# Patient Record
Sex: Female | Born: 1985 | Race: White | Hispanic: No | Marital: Married | Smoking: Never smoker
Health system: Southern US, Community
[De-identification: ages and names within clinical notes are randomized; demographics above are authoritative.]

## PROBLEM LIST (undated history)

## (undated) DIAGNOSIS — F502 Bulimia nervosa, unspecified: Secondary | ICD-10-CM

## (undated) DIAGNOSIS — F329 Major depressive disorder, single episode, unspecified: Secondary | ICD-10-CM

## (undated) DIAGNOSIS — F32A Depression, unspecified: Secondary | ICD-10-CM

## (undated) DIAGNOSIS — E538 Deficiency of other specified B group vitamins: Secondary | ICD-10-CM

## (undated) DIAGNOSIS — F5000 Anorexia nervosa, unspecified: Secondary | ICD-10-CM

## (undated) DIAGNOSIS — F419 Anxiety disorder, unspecified: Secondary | ICD-10-CM

## (undated) DIAGNOSIS — N189 Chronic kidney disease, unspecified: Secondary | ICD-10-CM

## (undated) DIAGNOSIS — O471 False labor at or after 37 completed weeks of gestation: Secondary | ICD-10-CM

## (undated) DIAGNOSIS — N83202 Unspecified ovarian cyst, left side: Secondary | ICD-10-CM

## (undated) HISTORY — DX: Depression, unspecified: F32.A

## (undated) HISTORY — DX: Unspecified ovarian cyst, left side: N83.202

## (undated) HISTORY — DX: Anxiety disorder, unspecified: F41.9

## (undated) HISTORY — DX: Bulimia nervosa: F50.2

## (undated) HISTORY — DX: Deficiency of other specified B group vitamins: E53.8

## (undated) HISTORY — DX: Bulimia nervosa, unspecified: F50.20

## (undated) HISTORY — DX: Anorexia nervosa, unspecified: F50.00

## (undated) HISTORY — DX: Major depressive disorder, single episode, unspecified: F32.9

## (undated) HISTORY — DX: Chronic kidney disease, unspecified: N18.9

---

## 2011-04-27 DIAGNOSIS — N83202 Unspecified ovarian cyst, left side: Secondary | ICD-10-CM

## 2011-04-27 HISTORY — DX: Unspecified ovarian cyst, left side: N83.202

## 2011-09-15 ENCOUNTER — Ambulatory Visit (INDEPENDENT_AMBULATORY_CARE_PROVIDER_SITE_OTHER): Payer: BC Managed Care – PPO | Admitting: Obstetrics and Gynecology

## 2011-09-15 ENCOUNTER — Encounter: Payer: Self-pay | Admitting: Obstetrics and Gynecology

## 2011-09-15 VITALS — BP 112/64 | HR 68 | Resp 16 | Ht 59.0 in | Wt 105.0 lb

## 2011-09-15 DIAGNOSIS — Z139 Encounter for screening, unspecified: Secondary | ICD-10-CM

## 2011-09-15 DIAGNOSIS — N915 Oligomenorrhea, unspecified: Secondary | ICD-10-CM

## 2011-09-15 DIAGNOSIS — N912 Amenorrhea, unspecified: Secondary | ICD-10-CM

## 2011-09-15 DIAGNOSIS — Z8659 Personal history of other mental and behavioral disorders: Secondary | ICD-10-CM | POA: Insufficient documentation

## 2011-09-15 DIAGNOSIS — T148XXA Other injury of unspecified body region, initial encounter: Secondary | ICD-10-CM

## 2011-09-15 DIAGNOSIS — N926 Irregular menstruation, unspecified: Secondary | ICD-10-CM | POA: Insufficient documentation

## 2011-09-15 LAB — POCT URINE PREGNANCY: Preg Test, Ur: NEGATIVE

## 2011-09-15 NOTE — Progress Notes (Signed)
Addended by: Robin Searing on: 09/15/2011 04:38 PM   Modules accepted: Orders

## 2011-09-15 NOTE — Progress Notes (Addendum)
Patient reports abnormal menses since January.  A can be 2 weeks late.  She and her husband have been trying to conceive since 8 months ago. Patient reports vitamin B12 deficiency and her last shot was about a year ago.  She says that no one ever told her she had pernicious anemia and she is never known herself did be anemic.  She also reports a history of eating disorders for which she went 9 months without having a cycle.  This was when she was 55 or 26 years old.  Filed Vitals:   09/15/11 1408  BP: 112/64  Pulse: 68  Resp: 16   ROS: noncontributory  Pelvic exam:  VULVA: normal appearing vulva with no masses, tenderness or lesions,  VAGINA: normal appearing vagina with normal color and discharge, no lesions, CERVIX: normal appearing cervix without discharge or lesions,  UTERUS: uterus is normal size, shape, consistency and nontender,  ADNEXA: normal adnexa in size, nontender and no masses.  Results for orders placed in visit on 09/15/11  POCT URINE PREGNANCY      Component Value Range   Preg Test, Ur Negative       Assessment and plan Trying to conceive x Schedule ultrasound Check labs today including TSH prolactin CBC vitamin D vitamin B12 and PT PTT Return to office after ultrasound

## 2011-09-16 LAB — CBC
MCH: 31.3 pg (ref 26.0–34.0)
MCV: 90.7 fL (ref 78.0–100.0)
Platelets: 247 10*3/uL (ref 150–400)
RBC: 4.63 MIL/uL (ref 3.87–5.11)

## 2011-09-16 LAB — PROTIME-INR: INR: 1.08 (ref <1.50)

## 2011-09-16 LAB — APTT: aPTT: 31 seconds (ref 24–37)

## 2011-09-21 ENCOUNTER — Telehealth: Payer: Self-pay | Admitting: Obstetrics and Gynecology

## 2011-09-21 NOTE — Telephone Encounter (Signed)
Dr. Su Hilt, Is her Vit B-12 level being 1592 anything to worry about? It doesn't look like she's on any supplementation. She's calling for results and everything else was ok...Marland KitchenMarland KitchenMarland Kitchenbut that doesn't look right. Pls advise. Thanks, [employer]	[place]

## 2011-09-21 NOTE — Telephone Encounter (Signed)
[name]/epic °

## 2011-09-23 NOTE — Telephone Encounter (Signed)
[name]/elect. 

## 2011-09-27 NOTE — Telephone Encounter (Signed)
Are you sure she isn't taking supplements?  Refer her to her PCP with results.

## 2011-10-02 NOTE — Telephone Encounter (Signed)
Has anyone followed up on this yet?

## 2011-10-04 ENCOUNTER — Other Ambulatory Visit: Payer: Self-pay | Admitting: Obstetrics and Gynecology

## 2011-10-04 ENCOUNTER — Other Ambulatory Visit (INDEPENDENT_AMBULATORY_CARE_PROVIDER_SITE_OTHER): Payer: BC Managed Care – PPO

## 2011-10-04 ENCOUNTER — Telehealth: Payer: Self-pay

## 2011-10-04 ENCOUNTER — Ambulatory Visit (INDEPENDENT_AMBULATORY_CARE_PROVIDER_SITE_OTHER): Payer: BC Managed Care – PPO | Admitting: Obstetrics and Gynecology

## 2011-10-04 ENCOUNTER — Encounter: Payer: Self-pay | Admitting: Obstetrics and Gynecology

## 2011-10-04 VITALS — BP 100/60 | Ht 60.0 in | Wt 105.0 lb

## 2011-10-04 DIAGNOSIS — N926 Irregular menstruation, unspecified: Secondary | ICD-10-CM

## 2011-10-04 DIAGNOSIS — Z8639 Personal history of other endocrine, nutritional and metabolic disease: Secondary | ICD-10-CM

## 2011-10-04 DIAGNOSIS — Z87898 Personal history of other specified conditions: Secondary | ICD-10-CM

## 2011-10-04 DIAGNOSIS — N979 Female infertility, unspecified: Secondary | ICD-10-CM

## 2011-10-04 NOTE — Telephone Encounter (Signed)
Spoke to pt re labs. Her Vit B 12 level was really high. All other tests normal. Per AR, I Called pt to be sure she isn't taking supplements. She actually states she is Vit B deficient and she does take SL Vit B supplements tid. PCP follows this. Her PCP is Dr. Warrick Parisian. I told pt I'd check back with Dr Su Hilt and see what she says and call her back. Melody Comas A

## 2011-10-04 NOTE — Progress Notes (Signed)
Ultrasound today uterus 6.2 x 3.5 x 3.3 cm endometrial thickness 0.8 cm bilateral ovaries within normal limits except there is a 4.3 cm right hemorrhagic cyst noted with suggested followup in 6-8 weeks.  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

## 2011-10-04 NOTE — Progress Notes (Signed)
Addended by: Osborn Coho on: 10/04/2011 08:16 PM   Modules accepted: Orders

## 2011-10-13 NOTE — Telephone Encounter (Signed)
Clydie Braun, please enter normal labs into phone tree. Thanks, Rinaldo Cloud

## 2011-11-04 ENCOUNTER — Other Ambulatory Visit: Payer: BC Managed Care – PPO

## 2011-11-04 ENCOUNTER — Other Ambulatory Visit: Payer: Self-pay

## 2011-11-04 DIAGNOSIS — N978 Female infertility of other origin: Secondary | ICD-10-CM

## 2011-11-04 DIAGNOSIS — N883 Incompetence of cervix uteri: Secondary | ICD-10-CM

## 2011-11-15 ENCOUNTER — Other Ambulatory Visit: Payer: Self-pay

## 2011-11-15 DIAGNOSIS — N83209 Unspecified ovarian cyst, unspecified side: Secondary | ICD-10-CM

## 2011-11-16 ENCOUNTER — Ambulatory Visit (INDEPENDENT_AMBULATORY_CARE_PROVIDER_SITE_OTHER): Payer: BC Managed Care – PPO

## 2011-11-16 ENCOUNTER — Ambulatory Visit (INDEPENDENT_AMBULATORY_CARE_PROVIDER_SITE_OTHER): Payer: BC Managed Care – PPO | Admitting: Obstetrics and Gynecology

## 2011-11-16 ENCOUNTER — Encounter: Payer: Self-pay | Admitting: Obstetrics and Gynecology

## 2011-11-16 ENCOUNTER — Other Ambulatory Visit: Payer: Self-pay | Admitting: Obstetrics and Gynecology

## 2011-11-16 VITALS — BP 100/62 | Ht 60.0 in | Wt 103.0 lb

## 2011-11-16 DIAGNOSIS — Z3490 Encounter for supervision of normal pregnancy, unspecified, unspecified trimester: Secondary | ICD-10-CM

## 2011-11-16 DIAGNOSIS — N83209 Unspecified ovarian cyst, unspecified side: Secondary | ICD-10-CM

## 2011-11-16 DIAGNOSIS — Z139 Encounter for screening, unspecified: Secondary | ICD-10-CM

## 2011-11-16 DIAGNOSIS — Z331 Pregnant state, incidental: Secondary | ICD-10-CM

## 2011-11-16 DIAGNOSIS — N83202 Unspecified ovarian cyst, left side: Secondary | ICD-10-CM

## 2011-11-16 LAB — POCT URINE PREGNANCY: Preg Test, Ur: POSITIVE

## 2011-11-16 LAB — HCG, QUANTITATIVE, PREGNANCY: hCG, Beta Chain, Quant, S: 1189.2 m[IU]/mL

## 2011-11-16 NOTE — Addendum Note (Signed)
Addended by: Osborn Coho on: 11/16/2011 10:21 AM   Modules accepted: Orders

## 2011-11-16 NOTE — Progress Notes (Signed)
Ultrasound today uterus 7.1 x 3.7 x 3.7 cm normal right ovary with resolution of hemorrhagic cyst left ovary measures 3.3 x 5.2 x 3.8 with a 3.5 cm simple cyst, question of a small GS in uterine fundus Trying to conceive with prog 15.9 (ovulatory) UPT + 4wks and 4days by LMP of 10/15/11 Denies sxs except some cramping  Filed Vitals:   11/16/11 0949  BP: 100/62     A/P Quant today, then in 48hrs (lab visit only) If nl rise, will plan u/s in about 3wks for dating secondary to h/o abnl cycles Samples PNV Simple left ovarian cyst Resolution of rt hemorrhagic cyst

## 2011-11-18 ENCOUNTER — Other Ambulatory Visit: Payer: BC Managed Care – PPO

## 2011-11-18 DIAGNOSIS — Z3490 Encounter for supervision of normal pregnancy, unspecified, unspecified trimester: Secondary | ICD-10-CM

## 2011-11-18 LAB — HCG, QUANTITATIVE, PREGNANCY: hCG, Beta Chain, Quant, S: 2458.5 m[IU]/mL

## 2011-11-23 ENCOUNTER — Telehealth: Payer: Self-pay

## 2011-11-23 ENCOUNTER — Other Ambulatory Visit: Payer: Self-pay

## 2011-11-23 DIAGNOSIS — E538 Deficiency of other specified B group vitamins: Secondary | ICD-10-CM

## 2011-11-23 NOTE — Telephone Encounter (Signed)
Called [facility] hematology dept. To let them know I'm putting referral order in epic for Teisha to see hemaologist for vit B-12 deficiency. They will contact her. Melody Comas A

## 2011-11-23 NOTE — Telephone Encounter (Signed)
Late entry for 11/16/2011. Spoke to pt to let her know that quants are rising appropriately. She should keep all of her upcoming appts. We will also be coordinating a referral to hematologist to f/u on pts history of Vitamin B 12 deficiency. This is very important, especially now that she is newly pregnant. Jean Ellis A

## 2011-11-24 ENCOUNTER — Telehealth: Payer: Self-pay | Admitting: Hematology and Oncology

## 2011-11-24 NOTE — Telephone Encounter (Signed)
S/W PT RE APPT W/LO. PER PT SHE IS NOT SURE WHEN SHE CAN COME IN SHE WILL CALL ME BACK TOMORROW.

## 2011-11-26 ENCOUNTER — Telehealth: Payer: Self-pay | Admitting: Hematology and Oncology

## 2011-11-26 NOTE — Telephone Encounter (Signed)
S/w pt again today and she is not able to speak with me right now and doesn't know when she can come in for an appt. Per pt she is at work and will call me back re scheduling. Pt is aware that I can only hold onto her referral for 72 hrs before returning it to the referring office. 1st attempt to contact pt was 7/31.

## 2011-12-02 ENCOUNTER — Ambulatory Visit (INDEPENDENT_AMBULATORY_CARE_PROVIDER_SITE_OTHER): Payer: BC Managed Care – PPO | Admitting: Obstetrics and Gynecology

## 2011-12-02 DIAGNOSIS — Z331 Pregnant state, incidental: Secondary | ICD-10-CM

## 2011-12-02 LAB — POCT URINALYSIS DIPSTICK
Bilirubin, UA: NEGATIVE
Glucose, UA: NEGATIVE
Nitrite, UA: NEGATIVE
Spec Grav, UA: 1.02
Urobilinogen, UA: NEGATIVE

## 2011-12-03 ENCOUNTER — Other Ambulatory Visit: Payer: Self-pay | Admitting: Obstetrics and Gynecology

## 2011-12-03 LAB — PRENATAL PANEL VII
Eosinophils Absolute: 0 10*3/uL (ref 0.0–0.7)
HIV: NONREACTIVE
Hemoglobin: 12.6 g/dL (ref 12.0–15.0)
Hepatitis B Surface Ag: NEGATIVE
Lymphs Abs: 2.2 10*3/uL (ref 0.7–4.0)
MCH: 29.5 pg (ref 26.0–34.0)
Monocytes Relative: 8 % (ref 3–12)
Neutro Abs: 5.3 10*3/uL (ref 1.7–7.7)
Neutrophils Relative %: 64 % (ref 43–77)
Platelets: 234 10*3/uL (ref 150–400)
RBC: 4.27 MIL/uL (ref 3.87–5.11)
Rubella: 17.4 IU/mL
WBC: 8.2 10*3/uL (ref 4.0–10.5)

## 2011-12-03 MED ORDER — ONDANSETRON 8 MG PO TBDP: 8.0000 mg | ORAL_TABLET | Freq: Three times a day (TID) | ORAL | Status: AC | PRN

## 2011-12-03 NOTE — Telephone Encounter (Signed)
Ar pt 

## 2011-12-03 NOTE — Telephone Encounter (Signed)
Pt requesting Zofran ODT, rx e-prescribed to pt's pharmacy, pt's husband at pharmacy to pick up.  Called [facility] to make sure rx was received and it was received per pharmacy representative.

## 2011-12-04 LAB — CULTURE, OB URINE
Colony Count: NO GROWTH
Organism ID, Bacteria: NO GROWTH

## 2011-12-07 ENCOUNTER — Ambulatory Visit (INDEPENDENT_AMBULATORY_CARE_PROVIDER_SITE_OTHER): Payer: BC Managed Care – PPO

## 2011-12-07 ENCOUNTER — Other Ambulatory Visit: Payer: BC Managed Care – PPO

## 2011-12-07 ENCOUNTER — Ambulatory Visit (INDEPENDENT_AMBULATORY_CARE_PROVIDER_SITE_OTHER): Payer: BC Managed Care – PPO | Admitting: Obstetrics and Gynecology

## 2011-12-07 ENCOUNTER — Encounter: Payer: Self-pay | Admitting: Obstetrics and Gynecology

## 2011-12-07 ENCOUNTER — Other Ambulatory Visit: Payer: Self-pay | Admitting: Emergency Medicine

## 2011-12-07 VITALS — BP 90/66 | Wt 102.0 lb

## 2011-12-07 DIAGNOSIS — Z331 Pregnant state, incidental: Secondary | ICD-10-CM

## 2011-12-07 DIAGNOSIS — F411 Generalized anxiety disorder: Secondary | ICD-10-CM

## 2011-12-07 DIAGNOSIS — Z8659 Personal history of other mental and behavioral disorders: Secondary | ICD-10-CM

## 2011-12-07 DIAGNOSIS — F3289 Other specified depressive episodes: Secondary | ICD-10-CM

## 2011-12-07 DIAGNOSIS — F419 Anxiety disorder, unspecified: Secondary | ICD-10-CM

## 2011-12-07 DIAGNOSIS — Z36 Encounter for antenatal screening of mother: Secondary | ICD-10-CM

## 2011-12-07 DIAGNOSIS — F32A Depression, unspecified: Secondary | ICD-10-CM

## 2011-12-07 DIAGNOSIS — Z124 Encounter for screening for malignant neoplasm of cervix: Secondary | ICD-10-CM

## 2011-12-07 DIAGNOSIS — F329 Major depressive disorder, single episode, unspecified: Secondary | ICD-10-CM

## 2011-12-07 LAB — US OB COMP LESS 14 WKS

## 2011-12-07 NOTE — Progress Notes (Signed)
Pt declines genetic screening  WNL dating u/s today EDD 07/21/12

## 2011-12-07 NOTE — Progress Notes (Signed)
Patient ID: Jean Ellis, female   DOB: [birth year], 26 y.o.   MRN: [id number]  Jean Ellis is here for a new obstetrical visit.  Taking PNV, certain of LMP not on birth control was planned pg. States past hx age 40-18 of anorexia, bulemia, anxiety, and depression had counseling. [redacted]w[redacted]d IUP ovaries WNL [place] today agrees with EDC. @IPILAPH @ OB History    Grav Para Term Preterm Abortions TAB SAB Ect Mult Living   1              Past Medical History  Diagnosis Date  . Vitamin B12 deficiency   . Ovarian cyst, left 2013    seen on recent US  . Chronic kidney disease     Frequent kidney infections @ 4-5 yoa  . Anxiety     Prozac in the past  . Depression     Prozac in the past   History reviewed. No pertinent past surgical history. Family History: family history includes Alcohol abuse in her father; Anemia in her mother; Anxiety disorder in her father; COPD in her father; Depression in her father; Diabetes in her paternal grandfather and paternal grandmother; Drug abuse in her father; Liver disease in her father; and Thyroid disease in her maternal aunt. Social History:  reports that she has never smoked. She has never used smokeless tobacco. She reports that she does not drink alcohol or use illicit drugs.    Blood pressure 90/66, weight 102 lb (46.267 kg), last menstrual period 10/15/2011.  Physical exam: Calm, no distress Alert, appropriate appearance for age. No acute distress HEENT Grossly normal Thyroid no masses, nodes or enlargement  lungs clear bilaterally, AP RRR, breasts bilaterally no masses, dimpling, drainage, abd nt, bowel sounds active No edema to lower extremities EGBUS and perineum wnl, normal hair distribution Vagina pink moist Cervix LTC no cerical motion tenderness appears adequate 158 FHTS  Prenatal labs: ABO, Rh: O/POS/-- (08/08 1525) Antibody: NEG (08/08 1525) Rubella:  Immune RPR: NON REAC (08/08 1525)  HBsAg: NEGATIVE (08/08 1525)  HIV: NON  REACTIVE (08/08 1525)  GBS:    Prenatal labs wnl Assessment/Plan: 7 4/7 week IUP Gc/chl sent Wet prep: neg PH not indicated Pap: sent WU:[id number] Genetic screen:declines f/o 5 weeks.  Collaboration with Dr. Su Hilt. [name] 12/07/2011, 11:58 AM Lavera Guise, CNM

## 2011-12-09 ENCOUNTER — Telehealth: Payer: Self-pay | Admitting: Hematology and Oncology

## 2011-12-09 LAB — PAP IG, CT-NG, RFX HPV ASCU: Chlamydia Probe Amp: NEGATIVE

## 2011-12-09 NOTE — Telephone Encounter (Signed)
Pt has not returned calls to our office re scheduling an appt w/LO. I spoke with pt on two occasions and pt states she couldn't talk and would call me back. Pt has since not responded. Called pt again today and lmonvm letting her know referral has been sent back to referring and she could let [facility] know when she is ready to schedule this appt. Chart back to HIM.

## 2012-01-11 ENCOUNTER — Encounter: Payer: Self-pay | Admitting: Obstetrics and Gynecology

## 2012-01-11 ENCOUNTER — Ambulatory Visit (INDEPENDENT_AMBULATORY_CARE_PROVIDER_SITE_OTHER): Payer: BC Managed Care – PPO | Admitting: Obstetrics and Gynecology

## 2012-01-11 VITALS — BP 88/64 | Wt 102.0 lb

## 2012-01-11 DIAGNOSIS — Z331 Pregnant state, incidental: Secondary | ICD-10-CM

## 2012-01-11 DIAGNOSIS — Z3490 Encounter for supervision of normal pregnancy, unspecified, unspecified trimester: Secondary | ICD-10-CM

## 2012-01-11 NOTE — Progress Notes (Signed)
No complaints N/V improved Plans to go to [facility] here in town to find out sex of baby at about 15wks U/S for anatomy due 18-20wks Declines genetic testing Reviewed labs RTO 4wks

## 2012-02-10 ENCOUNTER — Ambulatory Visit (INDEPENDENT_AMBULATORY_CARE_PROVIDER_SITE_OTHER): Payer: BC Managed Care – PPO | Admitting: Obstetrics and Gynecology

## 2012-02-10 VITALS — BP 90/60 | Wt 105.0 lb

## 2012-02-10 DIAGNOSIS — Z331 Pregnant state, incidental: Secondary | ICD-10-CM

## 2012-02-10 DIAGNOSIS — Z3490 Encounter for supervision of normal pregnancy, unspecified, unspecified trimester: Secondary | ICD-10-CM

## 2012-02-10 NOTE — Progress Notes (Signed)
[redacted]w[redacted]d Doing well Cont to decline genetic testing Anatomy scan at [facility] in 2wks

## 2012-02-23 ENCOUNTER — Other Ambulatory Visit: Payer: Self-pay

## 2012-02-23 DIAGNOSIS — Z3689 Encounter for other specified antenatal screening: Secondary | ICD-10-CM

## 2012-02-24 ENCOUNTER — Ambulatory Visit (INDEPENDENT_AMBULATORY_CARE_PROVIDER_SITE_OTHER): Payer: BC Managed Care – PPO

## 2012-02-24 ENCOUNTER — Ambulatory Visit (INDEPENDENT_AMBULATORY_CARE_PROVIDER_SITE_OTHER): Payer: BC Managed Care – PPO | Admitting: Obstetrics and Gynecology

## 2012-02-24 ENCOUNTER — Encounter: Payer: Self-pay | Admitting: Obstetrics and Gynecology

## 2012-02-24 VITALS — BP 90/60 | Wt 105.0 lb

## 2012-02-24 DIAGNOSIS — Z8639 Personal history of other endocrine, nutritional and metabolic disease: Secondary | ICD-10-CM

## 2012-02-24 DIAGNOSIS — Z3689 Encounter for other specified antenatal screening: Secondary | ICD-10-CM

## 2012-02-24 DIAGNOSIS — Z3490 Encounter for supervision of normal pregnancy, unspecified, unspecified trimester: Secondary | ICD-10-CM | POA: Insufficient documentation

## 2012-02-24 DIAGNOSIS — Z331 Pregnant state, incidental: Secondary | ICD-10-CM

## 2012-02-24 LAB — CBC
Hemoglobin: 11.9 g/dL — ABNORMAL LOW (ref 12.0–15.0)
MCHC: 34.5 g/dL (ref 30.0–36.0)
RDW: 13.6 % (ref 11.5–15.5)
WBC: 11 10*3/uL — ABNORMAL HIGH (ref 4.0–10.5)

## 2012-02-24 NOTE — Addendum Note (Signed)
Addended by: Marla Roe A on: 02/24/2012 02:48 PM   Modules accepted: Orders

## 2012-02-24 NOTE — Progress Notes (Signed)
[redacted]w[redacted]d U/S today - C/w dates, cvx closed 3.3cm, ant placenta, no anomalies, female TSH, cbc, vit B12 H/o vit B12 def and pt said she has gotten dizzy when it was low in the past Also c/o sinus issues and re trying antihistamines but if persists consider referral to ENT

## 2012-02-24 NOTE — Addendum Note (Signed)
Addended by: Marla Roe A on: 02/24/2012 03:37 PM   Modules accepted: Orders

## 2012-02-25 LAB — VITAMIN B12: Vitamin B-12: 411 pg/mL (ref 211–911)

## 2012-02-25 LAB — US OB COMP + 14 WK

## 2012-02-25 LAB — TSH: TSH: 1.443 u[IU]/mL (ref 0.350–4.500)

## 2012-03-21 ENCOUNTER — Ambulatory Visit (INDEPENDENT_AMBULATORY_CARE_PROVIDER_SITE_OTHER): Payer: BC Managed Care – PPO | Admitting: Obstetrics and Gynecology

## 2012-03-21 VITALS — BP 92/60 | Wt 111.0 lb

## 2012-03-21 DIAGNOSIS — Z3490 Encounter for supervision of normal pregnancy, unspecified, unspecified trimester: Secondary | ICD-10-CM

## 2012-03-21 DIAGNOSIS — Z331 Pregnant state, incidental: Secondary | ICD-10-CM

## 2012-03-21 NOTE — Progress Notes (Signed)
[redacted]w[redacted]d C/o rare pressure - declined exam S<D but pt is small and husband weighed 5lbs at birth - u/s for efw on dec 18th No complaints Labs were wnl

## 2012-03-21 NOTE — Addendum Note (Signed)
Addended by: Marla Roe A on: 03/21/2012 06:04 PM   Modules accepted: Orders

## 2012-03-22 ENCOUNTER — Telehealth: Payer: Self-pay

## 2012-03-22 ENCOUNTER — Ambulatory Visit (INDEPENDENT_AMBULATORY_CARE_PROVIDER_SITE_OTHER): Payer: BC Managed Care – PPO

## 2012-03-22 ENCOUNTER — Telehealth: Payer: Self-pay | Admitting: Obstetrics and Gynecology

## 2012-03-22 ENCOUNTER — Ambulatory Visit (INDEPENDENT_AMBULATORY_CARE_PROVIDER_SITE_OTHER): Payer: BC Managed Care – PPO | Admitting: Obstetrics and Gynecology

## 2012-03-22 ENCOUNTER — Encounter: Payer: Self-pay | Admitting: Obstetrics and Gynecology

## 2012-03-22 ENCOUNTER — Other Ambulatory Visit: Payer: Self-pay | Admitting: Obstetrics and Gynecology

## 2012-03-22 VITALS — BP 96/60 | Temp 98.6°F | Wt 111.0 lb

## 2012-03-22 DIAGNOSIS — O26879 Cervical shortening, unspecified trimester: Secondary | ICD-10-CM

## 2012-03-22 DIAGNOSIS — O26899 Other specified pregnancy related conditions, unspecified trimester: Secondary | ICD-10-CM

## 2012-03-22 DIAGNOSIS — N898 Other specified noninflammatory disorders of vagina: Secondary | ICD-10-CM

## 2012-03-22 LAB — POCT URINALYSIS DIPSTICK
Blood, UA: NEGATIVE
Glucose, UA: NEGATIVE
Nitrite, UA: NEGATIVE
Urobilinogen, UA: NEGATIVE
pH, UA: >=9

## 2012-03-22 LAB — POCT WET PREP (WET MOUNT)
Clue Cells Wet Prep Whiff POC: NEGATIVE
Trichomonas Wet Prep HPF POC: NEGATIVE
pH: 4.5

## 2012-03-22 NOTE — Telephone Encounter (Signed)
TC from pt. States is having watery vaginal D/C today, enough to wet underwear. Lower abd discomfort. No recent IC. Scheduled for eval with DR SR today.

## 2012-03-22 NOTE — Progress Notes (Signed)
[redacted]w[redacted]d   Pt complains of watery vaginal discharge noticed today. Pt without complaints of burning or itching

## 2012-03-22 NOTE — Telephone Encounter (Signed)
LVM to advise pt that FFN was negative.  Jean Ellis

## 2012-03-22 NOTE — Progress Notes (Signed)
[redacted]w[redacted]d GFM  Short Cervix  Seen yesterday and was told S<D: ultrasound today: AGA with EFW 1 lbs 2 oz 33 % and normal AFI. Breech. Cervix= 4.26 cm. Left cerebral ventricle upper normal at 0.85 cm to be rechecked at [facility] This am noticed clear mucousy D/C ( felt like peeing) with cramping that worsens with change in position. No sex Wet prep is normal. Nitrazine is negative. Crist Fat -

## 2012-03-27 LAB — US OB TRANSVAGINAL

## 2012-03-27 LAB — US OB FOLLOW UP

## 2012-04-12 ENCOUNTER — Other Ambulatory Visit: Payer: BC Managed Care – PPO

## 2012-04-12 ENCOUNTER — Encounter: Payer: BC Managed Care – PPO | Admitting: Obstetrics and Gynecology

## 2012-04-18 ENCOUNTER — Encounter: Payer: Self-pay | Admitting: Obstetrics and Gynecology

## 2012-04-18 ENCOUNTER — Ambulatory Visit (INDEPENDENT_AMBULATORY_CARE_PROVIDER_SITE_OTHER): Payer: BC Managed Care – PPO

## 2012-04-18 ENCOUNTER — Ambulatory Visit (INDEPENDENT_AMBULATORY_CARE_PROVIDER_SITE_OTHER): Payer: BC Managed Care – PPO | Admitting: Obstetrics and Gynecology

## 2012-04-18 VITALS — BP 98/62 | Wt 113.0 lb

## 2012-04-18 DIAGNOSIS — IMO0002 Reserved for concepts with insufficient information to code with codable children: Secondary | ICD-10-CM | POA: Insufficient documentation

## 2012-04-18 DIAGNOSIS — Z331 Pregnant state, incidental: Secondary | ICD-10-CM

## 2012-04-18 DIAGNOSIS — Z3490 Encounter for supervision of normal pregnancy, unspecified, unspecified trimester: Secondary | ICD-10-CM

## 2012-04-18 DIAGNOSIS — Q897 Multiple congenital malformations, not elsewhere classified: Secondary | ICD-10-CM

## 2012-04-18 NOTE — Progress Notes (Signed)
Ultrasound shows:  SIUP  S=D     [place] EDD: 07/29/12           EFW: 1 lb 13 oz 31th%           AFI: n/a           Cervical length: 3.34 cm           Placenta localization: anterior           Fetal presentation: vertex Normal fluid. AP pocket = 5.0 cm.  Lateral ventricles are upper normal bilaterally. All other cranial anatomy is WNLs. No dilation of 3rd ventricle is seen. RT ventricle = 9.8 mm LT ventricle = 8.3 mm Previous measurements = RT: 5.2 mm LT: 8.5 mm cx closed. Normal adnexa

## 2012-04-18 NOTE — Progress Notes (Signed)
[redacted]w[redacted]d  GFM Ultrasound today to recheck cerebral ventricle upper limit: today still upper limit without dilatation and with normal intracranial anatomy Will repeat ultrasound at 34 weeks Glucola at [facility]

## 2012-04-26 NOTE — L&D Delivery Note (Signed)
Delivery Note At 2:49 PM a viable female, "Jean Ellis", was delivered via Vaginal, Spontaneous Delivery (Presentation: Left Occiput Anterior) with compound presentation of right arm.  APGAR: 8, 9; weight .   Placenta status: Intact, Spontaneous.  Cord: 3 vessels with the following complications: None.  Cord pH: NA  Anesthesia:  Epidural Episiotomy: None Lacerations: 2nd degree vaginal Suture Repair: 3.0 monocryl Est. Blood Loss (mL): 400  Mom to postpartum.  Baby to skin to skin.Marland Kitchen  [name] 07/15/2012, 3:24 PM

## 2012-05-12 ENCOUNTER — Telehealth: Payer: Self-pay | Admitting: Obstetrics and Gynecology

## 2012-05-12 ENCOUNTER — Encounter: Payer: Self-pay | Admitting: Obstetrics and Gynecology

## 2012-05-12 ENCOUNTER — Other Ambulatory Visit: Payer: BC Managed Care – PPO

## 2012-05-12 ENCOUNTER — Ambulatory Visit: Payer: BC Managed Care – PPO | Admitting: Obstetrics and Gynecology

## 2012-05-12 VITALS — BP 96/56 | Wt 116.0 lb

## 2012-05-12 DIAGNOSIS — O26849 Uterine size-date discrepancy, unspecified trimester: Secondary | ICD-10-CM

## 2012-05-12 DIAGNOSIS — Z331 Pregnant state, incidental: Secondary | ICD-10-CM

## 2012-05-12 LAB — FETAL FIBRONECTIN: Fetal Fibronectin: NEGATIVE

## 2012-05-12 NOTE — Telephone Encounter (Signed)
Called pt informed her that her FFN was negative

## 2012-05-12 NOTE — Progress Notes (Signed)
[redacted]w[redacted]d Glucola given today Pt has concerns of pressure feels like has a tampon in

## 2012-05-12 NOTE — Progress Notes (Signed)
[redacted]w[redacted]d GFM  C/O pressure: FFN done PTL precautions reviewed Glucola today FFN done [facility] Pelvic pain caused by baby lying on nerve endings Braxton hicks contractions discussed S<D: sono at [facility]

## 2012-05-13 LAB — GLUCOSE TOLERANCE, 1 HOUR (50G) W/O FASTING: Glucose, 1 Hour GTT: 141 mg/dL — ABNORMAL HIGH (ref 70–140)

## 2012-05-13 LAB — RPR

## 2012-05-15 ENCOUNTER — Telehealth: Payer: Self-pay | Admitting: Obstetrics and Gynecology

## 2012-05-15 ENCOUNTER — Other Ambulatory Visit: Payer: Self-pay | Admitting: Obstetrics and Gynecology

## 2012-05-15 DIAGNOSIS — O99810 Abnormal glucose complicating pregnancy: Secondary | ICD-10-CM

## 2012-05-15 NOTE — Telephone Encounter (Signed)
Notified pt of elevated glucola.  Sch pt for 3 hr GTT on 05-27-2012, pt could only go on a Saturday morning.  Mailed pt diet and appt info.

## 2012-05-15 NOTE — Progress Notes (Signed)
Quick Note:  Abnormal 1 hour Gucola. Needs 3 hour GTT scheduled. ______

## 2012-05-17 ENCOUNTER — Telehealth: Payer: Self-pay | Admitting: Obstetrics and Gynecology

## 2012-05-17 NOTE — Telephone Encounter (Signed)
Tc to pt c/o hemorrhoids and would like to know what it safe to use. Pt states it is not causing any pain or bleeding. Advised pt to try to sit in a tub of warm water and to avoid becoming constipated. Advised pt that if she becomes constipated she can use a stool softener for relief. Also advised pt to call the office if she experiences any severe bleeding or pain due to hemorrhoid. Pt voiced understanding.

## 2012-05-24 ENCOUNTER — Ambulatory Visit: Payer: BC Managed Care – PPO | Admitting: Obstetrics and Gynecology

## 2012-05-24 ENCOUNTER — Ambulatory Visit: Payer: BC Managed Care – PPO

## 2012-05-24 ENCOUNTER — Encounter: Payer: Self-pay | Admitting: Obstetrics and Gynecology

## 2012-05-24 VITALS — BP 94/54 | Wt 120.0 lb

## 2012-05-24 DIAGNOSIS — Z331 Pregnant state, incidental: Secondary | ICD-10-CM

## 2012-05-24 DIAGNOSIS — O26849 Uterine size-date discrepancy, unspecified trimester: Secondary | ICD-10-CM

## 2012-05-24 DIAGNOSIS — IMO0002 Reserved for concepts with insufficient information to code with codable children: Secondary | ICD-10-CM

## 2012-05-24 NOTE — Progress Notes (Signed)
[redacted]w[redacted]d  GFM Back pain exercise given 1 hr GTT= 141   3 hr GTT 05/27/12

## 2012-05-24 NOTE — Progress Notes (Signed)
[redacted]w[redacted]d Pt has a lot of mid back pain x 1 week

## 2012-05-25 LAB — US OB FOLLOW UP

## 2012-05-28 LAB — GLUCOSE TOLERANCE, 3 HOURS
Glucose Tolerance, 2 hour: 96 mg/dL (ref 70–164)
Glucose, GTT - 3 Hour: 89 mg/dL (ref 70–144)

## 2012-06-02 ENCOUNTER — Telehealth: Payer: Self-pay | Admitting: Obstetrics and Gynecology

## 2012-06-02 DIAGNOSIS — R7309 Other abnormal glucose: Secondary | ICD-10-CM

## 2012-06-05 NOTE — Telephone Encounter (Addendum)
Pt had 3 hr GTT on 05/15/12. Advised pt that labs were normal.   Darien Ramus, CMA

## 2012-06-14 ENCOUNTER — Other Ambulatory Visit: Payer: Self-pay

## 2012-06-14 ENCOUNTER — Ambulatory Visit: Payer: BC Managed Care – PPO | Admitting: Obstetrics and Gynecology

## 2012-06-14 ENCOUNTER — Ambulatory Visit: Payer: BC Managed Care – PPO

## 2012-06-14 ENCOUNTER — Encounter: Payer: Self-pay | Admitting: Obstetrics and Gynecology

## 2012-06-14 VITALS — BP 92/56 | Wt 123.0 lb

## 2012-06-14 DIAGNOSIS — IMO0002 Reserved for concepts with insufficient information to code with codable children: Secondary | ICD-10-CM

## 2012-06-14 DIAGNOSIS — O26849 Uterine size-date discrepancy, unspecified trimester: Secondary | ICD-10-CM

## 2012-06-14 DIAGNOSIS — Z331 Pregnant state, incidental: Secondary | ICD-10-CM

## 2012-06-14 NOTE — Progress Notes (Signed)
[redacted]w[redacted]d  GFM Ultrasound: AGA, EFW: 5 lbs 4 oz  32%, AFI normal

## 2012-06-14 NOTE — Progress Notes (Signed)
[redacted]w[redacted]d U/S today Pt has no complaints

## 2012-06-15 LAB — US OB FOLLOW UP

## 2012-06-20 ENCOUNTER — Ambulatory Visit: Payer: BC Managed Care – PPO | Admitting: Obstetrics and Gynecology

## 2012-06-20 ENCOUNTER — Encounter: Payer: Self-pay | Admitting: Obstetrics and Gynecology

## 2012-06-20 VITALS — BP 92/56 | Wt 123.0 lb

## 2012-06-20 DIAGNOSIS — IMO0002 Reserved for concepts with insufficient information to code with codable children: Secondary | ICD-10-CM

## 2012-06-20 DIAGNOSIS — Z331 Pregnant state, incidental: Secondary | ICD-10-CM

## 2012-06-20 NOTE — Progress Notes (Signed)
[redacted]w[redacted]d Pt has shooting pains underneath belly Pt wants cervix checked

## 2012-06-20 NOTE — Progress Notes (Signed)
[redacted]w[redacted]d Still very uncomfortable but coping No contractions GBS at next visit

## 2012-06-22 ENCOUNTER — Telehealth: Payer: Self-pay

## 2012-06-22 NOTE — Telephone Encounter (Signed)
Advised pt of apts and gap between. Pt ok with this. apts scheduled 06/26/2012 U/S @ 12:00 SR @ 1:30  Darien Ramus, CMA

## 2012-06-22 NOTE — Telephone Encounter (Signed)
VM from pt states that she is to have an u/s and visit next week. Was advised that there were not available apts wanting to see SR.  LVM to advise pt that we could schedule u/s on 06/26/2012 @ 12:00 and she could return to office for visit with SR @ 1:30. Pt is to return call.   Darien Ramus, CMA

## 2012-06-26 ENCOUNTER — Encounter: Payer: Self-pay | Admitting: Obstetrics and Gynecology

## 2012-06-26 ENCOUNTER — Ambulatory Visit: Payer: BC Managed Care – PPO | Admitting: Obstetrics and Gynecology

## 2012-06-26 ENCOUNTER — Ambulatory Visit: Payer: BC Managed Care – PPO

## 2012-06-26 VITALS — BP 96/56 | Wt 126.0 lb

## 2012-06-26 DIAGNOSIS — IMO0002 Reserved for concepts with insufficient information to code with codable children: Secondary | ICD-10-CM

## 2012-06-26 DIAGNOSIS — Z331 Pregnant state, incidental: Secondary | ICD-10-CM

## 2012-06-26 NOTE — Progress Notes (Signed)
[redacted]w[redacted]d GBS today Pt has no complaints  

## 2012-06-26 NOTE — Progress Notes (Signed)
[redacted]w[redacted]d GFM GBS & GC/CT today Ultrasound with AGA and normal AFI. Cerebral ventricles are measuring normal. Labor Signs Reviewed

## 2012-06-26 NOTE — Progress Notes (Signed)
[redacted]w[redacted]d GBS today Ultrasound shows:  SIUP  S=D     [place] EDD: 07/21/2012           EFW: 5 lb 15 oz 29%           AFI: 12.75 cm 40th %            Cervical length: not measured           Placenta localization: anterior           Fetal presentation: vertex Comments: Bilateral Cerebral Ventricles are within normal limits. Normal adnexa

## 2012-06-27 LAB — GC/CHLAMYDIA PROBE AMP: CT Probe RNA: NEGATIVE

## 2012-06-27 LAB — US OB FOLLOW UP

## 2012-06-28 ENCOUNTER — Telehealth: Payer: Self-pay

## 2012-06-28 LAB — STREP B DNA PROBE: GBSP: NEGATIVE

## 2012-06-28 NOTE — Telephone Encounter (Signed)
VM from pt wanting to know next apt date.  LVM to advise pt that visit is 07/04/2012 @ 1:30 with SR.   Darien Ramus, CMA

## 2012-07-03 ENCOUNTER — Telehealth: Payer: Self-pay

## 2012-07-03 NOTE — Telephone Encounter (Signed)
TC from pt states that she ate and drunk some more water. States that after eating the baby moved 5-8 times. States that she is still noticing slight d/c. Advised pt that we could schedule apt to come in to access d/c pt declines. Has apt on Thursday. Advised pt to continue to monitor movement. Advised that if no movement after next meal to call office. Continue to push fluid. If notice any bright red bleeding contact office. Labor signs reviewed.  Darien Ramus, CMA

## 2012-07-03 NOTE — Telephone Encounter (Signed)
Phone call from pt states that this morning she noticed some brownish d/c when she used the restroom and wiped. States that she is having some vaginal pressure but is normal for her. No contractions. States that she is feeling baby move. States that she feels it has been decreased, but pt has been busy this weekend and has not really paid much attention to movement. Pt states that she ate @ 7:15 a.m. Has had approximately 8 oz of water. Has only noticed d/c one time. Advised pt to increase water and eat high protein snack, count baby movements. Watch d/c. Call office back in a couple hours with report.  Pt agrees with plan and voiced understanding   Khalis, Hittle, CMA

## 2012-07-04 ENCOUNTER — Encounter: Payer: BC Managed Care – PPO | Admitting: Obstetrics and Gynecology

## 2012-07-06 ENCOUNTER — Encounter: Payer: Self-pay | Admitting: Obstetrics and Gynecology

## 2012-07-06 ENCOUNTER — Ambulatory Visit: Payer: BC Managed Care – PPO | Admitting: Obstetrics and Gynecology

## 2012-07-06 VITALS — BP 94/62 | Wt 126.0 lb

## 2012-07-06 DIAGNOSIS — Z331 Pregnant state, incidental: Secondary | ICD-10-CM

## 2012-07-06 NOTE — Progress Notes (Signed)
[redacted]w[redacted]d Contractions every 10 minutes last night, slept fine through the night and eased off today.  Membranes swept. Raspberry Tea Recommended.

## 2012-07-06 NOTE — Progress Notes (Signed)
[redacted]w[redacted]d Pt has no complaints  Pt wants cervix check

## 2012-07-10 ENCOUNTER — Observation Stay (HOSPITAL_COMMUNITY)
Admission: AD | Admit: 2012-07-10 | Discharge: 2012-07-10 | Disposition: A | Discharge status: Home / Self Care | Payer: BC Managed Care – PPO | Source: Ambulatory Visit | Attending: Obstetrics and Gynecology | Admitting: Obstetrics and Gynecology

## 2012-07-10 ENCOUNTER — Telehealth: Payer: Self-pay

## 2012-07-10 ENCOUNTER — Encounter (HOSPITAL_COMMUNITY): Payer: Self-pay | Admitting: *Deleted

## 2012-07-10 ENCOUNTER — Observation Stay (HOSPITAL_COMMUNITY): Payer: BC Managed Care – PPO

## 2012-07-10 ENCOUNTER — Ambulatory Visit: Payer: BC Managed Care – PPO | Admitting: Family Medicine

## 2012-07-10 VITALS — BP 100/70 | Wt 125.0 lb

## 2012-07-10 DIAGNOSIS — O479 False labor, unspecified: Principal | ICD-10-CM | POA: Insufficient documentation

## 2012-07-10 DIAGNOSIS — R109 Unspecified abdominal pain: Secondary | ICD-10-CM | POA: Insufficient documentation

## 2012-07-10 DIAGNOSIS — IMO0002 Reserved for concepts with insufficient information to code with codable children: Secondary | ICD-10-CM

## 2012-07-10 DIAGNOSIS — Z3490 Encounter for supervision of normal pregnancy, unspecified, unspecified trimester: Secondary | ICD-10-CM

## 2012-07-10 DIAGNOSIS — Z331 Pregnant state, incidental: Secondary | ICD-10-CM

## 2012-07-10 DIAGNOSIS — O26859 Spotting complicating pregnancy, unspecified trimester: Secondary | ICD-10-CM | POA: Insufficient documentation

## 2012-07-10 DIAGNOSIS — O26893 Other specified pregnancy related conditions, third trimester: Secondary | ICD-10-CM

## 2012-07-10 DIAGNOSIS — O471 False labor at or after 37 completed weeks of gestation: Secondary | ICD-10-CM | POA: Diagnosis not present

## 2012-07-10 HISTORY — DX: False labor at or after 37 completed weeks of gestation: O47.1

## 2012-07-10 LAB — CBC
MCH: 27.6 pg (ref 26.0–34.0)
Platelets: 248 10*3/uL (ref 150–400)
RBC: 3.44 MIL/uL — ABNORMAL LOW (ref 3.87–5.11)
WBC: 10.3 10*3/uL (ref 4.0–10.5)

## 2012-07-10 MED ORDER — ZOLPIDEM TARTRATE 5 MG PO TABS: 5.0000 mg | ORAL_TABLET | Freq: Every evening | ORAL | Status: DC | PRN

## 2012-07-10 MED ORDER — LACTATED RINGERS IV BOLUS (SEPSIS)
500.0000 mL | Freq: Once | INTRAVENOUS | Status: AC
  Administered 2012-07-10: 1000 mL via INTRAVENOUS

## 2012-07-10 MED ORDER — LACTATED RINGERS IV SOLN
INTRAVENOUS | Status: DC
  Administered 2012-07-10: 15:00:00 via INTRAVENOUS

## 2012-07-10 NOTE — Progress Notes (Signed)
Patient ID: Jean Ellis, female   DOB: 1985-10-29, 27 y.o.   MRN: [id number] History  27yo G1P0 at [redacted]w[redacted]d presented from the office with a non-reactive but reassuring NST.  Pt reports UCs since Friday with regularity at times and spacing out at times.  Pt noted small vaginal clots today.  +FM but the movements "have not been as much lately because the baby doesn't have as much room"  Patient Active Problem List  Diagnosis  . H/O bulimia nervosa  . H/O anorexia nervosa  . Irregular menstrual cycle  . H/O non anemic vitamin B12 deficiency - nl f/u  . Anxiety  . Depression  . Normal pregnancy  . Fetal anomaly     No chief complaint on file.    OB History   Grav Para Term Preterm Abortions TAB SAB Ect Mult Living   1               Past Medical History  Diagnosis Date  . Vitamin B12 deficiency   . Ovarian cyst, left 2013    seen on recent US  . Chronic kidney disease     Frequent kidney infections @ 4-5 yoa  . Anxiety     Prozac in the past  . Depression     Prozac in the past    History reviewed. No pertinent past surgical history.  Family History  Problem Relation Age of Onset  . Diabetes Paternal Grandmother   . Diabetes Paternal Grandfather   . Thyroid disease Maternal Aunt   . Anemia Mother   . COPD Father   . Liver disease Father   . Depression Father   . Anxiety disorder Father   . Drug abuse Father   . Alcohol abuse Father     History  Substance Use Topics  . Smoking status: Never Smoker   . Smokeless tobacco: Never Used  . Alcohol Use: No     Comment: 1 glass of wine bi-weekly    Allergies: No Known Allergies  Prescriptions prior to admission  Medication Sig Dispense Refill  . amoxicillin (AMOXIL) 250 MG capsule Take 250 mg by mouth 3 (three) times daily.      . Prenatal Vit-Fe Sulfate-FA (PRENATAL VITAMIN PO) Take 1 tablet by mouth daily.         Physical Exam   Blood pressure 125/58, pulse 113, temperature 98.2 F (36.8 C), resp.  rate 20, last menstrual period 10/15/2011.  Chest: Clear Heart:  RRR without murmur Abdomen: Gravid, NT Extremities: WNL Cx: 4cm / 60 / -2 / anterior   FHT: Cat 2; decreased variability; ? Late decels with 2 UCs UC: Q5-7 min   ED Course  IUP at [redacted]w[redacted]d Early vs Prodromal labor Equivocal FHT GBS neg  IV hydration Continue to monitor FHTs Re-evaluate cx in 1-2 hours   Jean Ellis CNM, [place] 07/10/2012 2:35 PM   Addendum  Cx unchanged FHT: Reactive; during sleep phase  Questionable - 2 episodes of late variables noted with UCs C/w Dr. Stefano Gaul BPP and AFI  Discussed plan of BPP and AFI and pt is in agreement; Pt is expressing the desire to "get this over with" d/t mainly the back pain she is experiencing.  Tearful with the idea of going home.   Jean Ellis CNM MSN 07/10/12   4:30 PM   Addendum Cx unchanged BPP 8/8 D/C home by myself and Hillary Reviewed the importance rest and sleep at this early stage; rv'd s/s of labor; FKC; rv'd importance of  good hydration

## 2012-07-10 NOTE — Telephone Encounter (Signed)
TC from pt states that she passed her mucous plug on Saturday. States that she has been having contractions but are not regular. Spoke to midwife on call Saturday but was never seen in the hosp. States that she has been having sharp stabbing pain in pelvic area since Saturday and has noticed some bright red blood when wiping. GFM. Advised pt she needed to be seen in the office today. Apt scheduled with LC, NP @ 10:30  Darien Ramus, CMA

## 2012-07-10 NOTE — Progress Notes (Signed)
[redacted]w[redacted]d Complains of contractions and spotting/clots this am.

## 2012-07-10 NOTE — Progress Notes (Signed)
[redacted]w[redacted]d Presents with irregular contractions x 24 hours and spotting on tissue.  Pink to bright red about dime size.  Pain 5/10.  Pain starts in lower abdomen and radiates to top.  Noticed they increase in frequency while walking.  Otherwise doing well with good fetal movement. A/P: NST: FHR 145 moderate variability and 10 x 10 accels.  Toco: irregular contractions q 2-10 minutes.  Mild to moderate. Pt sitting on exam bed/chair, NAD.  Discussed prodromal labor with patient, offered to go home and monitor contractions for regularity, she declined.   Sent over to MAU for monitoring and understands this will be done by midwives, Dr. Estanislado Pandy is unavailable. Discussed with J.Oxley/Stringer. ROB in 1 week if undelivered. Jean Grout, FNP-BC

## 2012-07-11 LAB — RPR: RPR Ser Ql: NONREACTIVE

## 2012-07-14 ENCOUNTER — Ambulatory Visit: Payer: BC Managed Care – PPO | Admitting: Obstetrics and Gynecology

## 2012-07-14 ENCOUNTER — Telehealth: Payer: Self-pay

## 2012-07-14 ENCOUNTER — Encounter: Payer: Self-pay | Admitting: Obstetrics and Gynecology

## 2012-07-14 ENCOUNTER — Inpatient Hospital Stay (HOSPITAL_COMMUNITY)
Admission: AD | Admit: 2012-07-14 | Discharge: 2012-07-17 | DRG: 373 | Disposition: A | Discharge status: Home / Self Care | Payer: BC Managed Care – PPO | Attending: Obstetrics and Gynecology | Admitting: Obstetrics and Gynecology

## 2012-07-14 ENCOUNTER — Encounter (HOSPITAL_COMMUNITY): Payer: Self-pay

## 2012-07-14 VITALS — BP 98/64 | Wt 123.0 lb

## 2012-07-14 DIAGNOSIS — O9903 Anemia complicating the puerperium: Secondary | ICD-10-CM | POA: Diagnosis not present

## 2012-07-14 DIAGNOSIS — Z331 Pregnant state, incidental: Secondary | ICD-10-CM

## 2012-07-14 DIAGNOSIS — IMO0002 Reserved for concepts with insufficient information to code with codable children: Secondary | ICD-10-CM

## 2012-07-14 DIAGNOSIS — O328XX0 Maternal care for other malpresentation of fetus, not applicable or unspecified: Secondary | ICD-10-CM | POA: Diagnosis present

## 2012-07-14 DIAGNOSIS — D649 Anemia, unspecified: Secondary | ICD-10-CM | POA: Diagnosis not present

## 2012-07-14 DIAGNOSIS — Z3490 Encounter for supervision of normal pregnancy, unspecified, unspecified trimester: Secondary | ICD-10-CM

## 2012-07-14 NOTE — Progress Notes (Signed)
[redacted]w[redacted]d Pt here for cervix check

## 2012-07-14 NOTE — MAU Note (Signed)
Contractions for a wk but more regular last 2 days. Baby is low and having a lot of  pressure

## 2012-07-14 NOTE — Progress Notes (Signed)
[redacted]w[redacted]d Contractions every 2-5 minutes, with more back pain, increased mucousy d/c IOL discussed desires to proceed but informed this would be an elective IOL and to the discretion of the MD on call Pt tearful: will attend grandfather's funeral this evening and may go in for labor check. Mom is in town for the weekend

## 2012-07-14 NOTE — Telephone Encounter (Signed)
TC from pt states that she has been having cx all week. Was seen in office on 03/17 by [facility] for cx and vaginal bleeding. Dilated to 4 cm at that time. Had NST in office. Was sent to MAU for monitoring but was sent home. Pt very upset about not being placed into the hosp and being induced with cervix being at 4 cm. Advised pt that they would not induce if there was no complications before 41 weeks due to increasing risk of c-section. Pt concerned because she is having cx but it is not the pain that SR had described that she would have. Pt request to see SR. Apt scheduled for 07/15/2012 @ 11:50 a.m   Darien Ramus, CMA

## 2012-07-15 ENCOUNTER — Encounter (HOSPITAL_COMMUNITY): Payer: Self-pay | Admitting: Anesthesiology

## 2012-07-15 ENCOUNTER — Inpatient Hospital Stay (HOSPITAL_COMMUNITY): Payer: BC Managed Care – PPO | Admitting: Anesthesiology

## 2012-07-15 ENCOUNTER — Encounter (HOSPITAL_COMMUNITY): Payer: Self-pay | Admitting: Obstetrics

## 2012-07-15 LAB — TYPE AND SCREEN
ABO/RH(D): O POS
Antibody Screen: NEGATIVE

## 2012-07-15 LAB — CBC
Hemoglobin: 10.5 g/dL — ABNORMAL LOW (ref 12.0–15.0)
MCHC: 33 g/dL (ref 30.0–36.0)
Platelets: 298 10*3/uL (ref 150–400)

## 2012-07-15 LAB — RPR: RPR Ser Ql: NONREACTIVE

## 2012-07-15 MED ORDER — ONDANSETRON HCL 4 MG/2ML IJ SOLN: 4.0000 mg | INTRAMUSCULAR | Status: DC | PRN

## 2012-07-15 MED ORDER — TETANUS-DIPHTH-ACELL PERTUSSIS 5-2.5-18.5 LF-MCG/0.5 IM SUSP: 0.5000 mL | Freq: Once | INTRAMUSCULAR | Status: DC

## 2012-07-15 MED ORDER — ZOLPIDEM TARTRATE 5 MG PO TABS: 5.0000 mg | ORAL_TABLET | Freq: Every evening | ORAL | Status: DC | PRN

## 2012-07-15 MED ORDER — FENTANYL CITRATE 0.05 MG/ML IJ SOLN
100.0000 ug | INTRAMUSCULAR | Status: DC | PRN
  Administered 2012-07-15: 100 ug via INTRAVENOUS
  Filled 2012-07-15: qty 2

## 2012-07-15 MED ORDER — OXYTOCIN 40 UNITS IN LACTATED RINGERS INFUSION - SIMPLE MED
62.5000 mL/h | INTRAVENOUS | Status: DC
  Administered 2012-07-15: 62.5 mL/h via INTRAVENOUS
  Filled 2012-07-15: qty 1000

## 2012-07-15 MED ORDER — FENTANYL 2.5 MCG/ML BUPIVACAINE 1/10 % EPIDURAL INFUSION (WH - ANES): 12.0000 mL/h | INTRAMUSCULAR | Status: DC | PRN

## 2012-07-15 MED ORDER — PHENYLEPHRINE 40 MCG/ML (10ML) SYRINGE FOR IV PUSH (FOR BLOOD PRESSURE SUPPORT)
PREFILLED_SYRINGE | INTRAVENOUS | Status: AC
  Filled 2012-07-15: qty 5

## 2012-07-15 MED ORDER — CITRIC ACID-SODIUM CITRATE 334-500 MG/5ML PO SOLN: 30.0000 mL | ORAL | Status: DC | PRN

## 2012-07-15 MED ORDER — DIPHENHYDRAMINE HCL 50 MG/ML IJ SOLN: 12.5000 mg | INTRAMUSCULAR | Status: DC | PRN

## 2012-07-15 MED ORDER — OXYTOCIN BOLUS FROM INFUSION
500.0000 mL | INTRAVENOUS | Status: DC
Start: 2012-07-15 — End: 2012-07-15
  Administered 2012-07-15: 500 mL via INTRAVENOUS

## 2012-07-15 MED ORDER — EPHEDRINE 5 MG/ML INJ: 10.0000 mg | INTRAVENOUS | Status: DC | PRN

## 2012-07-15 MED ORDER — LIDOCAINE HCL (PF) 1 % IJ SOLN
INTRAMUSCULAR | Status: DC | PRN
  Administered 2012-07-15 (×3): 4 mL

## 2012-07-15 MED ORDER — DIBUCAINE 1 % RE OINT
1.0000 "application " | TOPICAL_OINTMENT | RECTAL | Status: DC | PRN
  Administered 2012-07-15: 1 via RECTAL
  Filled 2012-07-15: qty 28

## 2012-07-15 MED ORDER — EPHEDRINE 5 MG/ML INJ
INTRAVENOUS | Status: AC
  Filled 2012-07-15: qty 4

## 2012-07-15 MED ORDER — IBUPROFEN 600 MG PO TABS
600.0000 mg | ORAL_TABLET | Freq: Four times a day (QID) | ORAL | Status: DC
  Administered 2012-07-15 – 2012-07-17 (×7): 600 mg via ORAL
  Filled 2012-07-15 (×7): qty 1

## 2012-07-15 MED ORDER — OXYCODONE-ACETAMINOPHEN 5-325 MG PO TABS
1.0000 | ORAL_TABLET | ORAL | Status: DC | PRN
  Administered 2012-07-16 – 2012-07-17 (×2): 1 via ORAL
  Filled 2012-07-15 (×2): qty 1

## 2012-07-15 MED ORDER — FENTANYL 2.5 MCG/ML BUPIVACAINE 1/10 % EPIDURAL INFUSION (WH - ANES)
14.0000 mL/h | INTRAMUSCULAR | Status: DC | PRN
  Administered 2012-07-15: 12 mL/h via EPIDURAL

## 2012-07-15 MED ORDER — LACTATED RINGERS IV SOLN: 500.0000 mL | INTRAVENOUS | Status: DC | PRN

## 2012-07-15 MED ORDER — SIMETHICONE 80 MG PO CHEW: 80.0000 mg | CHEWABLE_TABLET | ORAL | Status: DC | PRN

## 2012-07-15 MED ORDER — OXYCODONE-ACETAMINOPHEN 5-325 MG PO TABS: 1.0000 | ORAL_TABLET | ORAL | Status: DC | PRN

## 2012-07-15 MED ORDER — ONDANSETRON HCL 4 MG PO TABS: 4.0000 mg | ORAL_TABLET | ORAL | Status: DC | PRN

## 2012-07-15 MED ORDER — IBUPROFEN 600 MG PO TABS: 600.0000 mg | ORAL_TABLET | Freq: Four times a day (QID) | ORAL | Status: DC | PRN

## 2012-07-15 MED ORDER — PHENYLEPHRINE 40 MCG/ML (10ML) SYRINGE FOR IV PUSH (FOR BLOOD PRESSURE SUPPORT): 80.0000 ug | PREFILLED_SYRINGE | INTRAVENOUS | Status: DC | PRN

## 2012-07-15 MED ORDER — ONDANSETRON HCL 4 MG/2ML IJ SOLN: 4.0000 mg | Freq: Four times a day (QID) | INTRAMUSCULAR | Status: DC | PRN

## 2012-07-15 MED ORDER — PRENATAL MULTIVITAMIN CH
1.0000 | ORAL_TABLET | Freq: Every day | ORAL | Status: DC
  Administered 2012-07-16: 1 via ORAL
  Filled 2012-07-15: qty 1

## 2012-07-15 MED ORDER — LACTATED RINGERS IV SOLN
500.0000 mL | Freq: Once | INTRAVENOUS | Status: AC
  Administered 2012-07-15: 500 mL via INTRAVENOUS

## 2012-07-15 MED ORDER — DIPHENHYDRAMINE HCL 25 MG PO CAPS: 25.0000 mg | ORAL_CAPSULE | Freq: Four times a day (QID) | ORAL | Status: DC | PRN

## 2012-07-15 MED ORDER — BENZOCAINE-MENTHOL 20-0.5 % EX AERO
1.0000 "application " | INHALATION_SPRAY | CUTANEOUS | Status: DC | PRN
  Administered 2012-07-15: 1 via TOPICAL
  Filled 2012-07-15: qty 56

## 2012-07-15 MED ORDER — WITCH HAZEL-GLYCERIN EX PADS
1.0000 "application " | MEDICATED_PAD | CUTANEOUS | Status: DC | PRN
  Administered 2012-07-15: 1 via TOPICAL

## 2012-07-15 MED ORDER — LANOLIN HYDROUS EX OINT: TOPICAL_OINTMENT | CUTANEOUS | Status: DC | PRN

## 2012-07-15 MED ORDER — LIDOCAINE HCL (PF) 1 % IJ SOLN
30.0000 mL | INTRAMUSCULAR | Status: DC | PRN
  Administered 2012-07-15: 30 mL via SUBCUTANEOUS
  Filled 2012-07-15 (×2): qty 30

## 2012-07-15 MED ORDER — LACTATED RINGERS IV SOLN
INTRAVENOUS | Status: DC
  Administered 2012-07-15 (×3): via INTRAVENOUS

## 2012-07-15 MED ORDER — FENTANYL 2.5 MCG/ML BUPIVACAINE 1/10 % EPIDURAL INFUSION (WH - ANES)
INTRAMUSCULAR | Status: AC
  Filled 2012-07-15: qty 125

## 2012-07-15 MED ORDER — ACETAMINOPHEN 325 MG PO TABS: 650.0000 mg | ORAL_TABLET | ORAL | Status: DC | PRN

## 2012-07-15 MED ORDER — SENNOSIDES-DOCUSATE SODIUM 8.6-50 MG PO TABS
2.0000 | ORAL_TABLET | Freq: Every day | ORAL | Status: DC
  Administered 2012-07-15 – 2012-07-16 (×2): 2 via ORAL

## 2012-07-15 NOTE — Progress Notes (Signed)
  Subjective: Uncomfortable with contractions--"ready to get things going".  Objective: BP 112/68  Pulse 83  Temp(Src) 97.9 F (36.6 C) (Oral)  Resp 20  Ht 4\' 11"  (1.499 m)  Wt 128 lb (58.06 kg)  BMI 25.84 kg/m2  SpO2 100%  LMP 10/15/2011      FHT:  Category 1 UC:   q 3-4 min, moderate SVE:   Dilation: 4.5 Effacement (%): 80 Station: -1 Exam by:: Jean Ellis, CNM  Assessment / Plan: Early labor Reviewed options for therapeutic sedation vs more active management of labor.  Patient requests AROM.  R&B reviewed, including need for further augmentation.  Patient seems to understand these risks and wishes to proceed. AROM--clear fluid. Epidural prn Will augment if no labor progress in 2 hours.  Jean Ellis 07/15/2012, 8:04 AM

## 2012-07-15 NOTE — Progress Notes (Signed)
Patient ID: Jean Ellis, female   DOB: 04-04-1986, 27 y.o.   MRN: [id number] Jean Ellis is a 26 y.o. G1P0 at [redacted]w[redacted]d admitted for early labor   Subjective: Pt has been resting, declined IV pain meds,   Objective: BP 118/67  Pulse 82  Temp(Src) 98.3 F (36.8 C) (Oral)  Resp 18  Ht 4\' 11"  (1.499 m)  Wt 128 lb (58.06 kg)  BMI 25.84 kg/m2  SpO2 100%  LMP 10/15/2011     FHT:  FHR: 130 bpm, variability: moderate,  accelerations:  Present,  decelerations:  Absent UC:   regular, every 5 minutes SVE:   Dilation: 4.5 Effacement (%): 80 Station: 0 Exam by:: Rande Lawman CNM   Deferred   Assessment / Plan: not in active labor  Labor: early vs. prodromal labor Preeclampsia:  no signs or symptoms of toxicity Fetal Wellbeing:  Category I Pain Control:  n/a  Will continue to observe overnight for cervical change, will reevaluate in AM or PRN     Update physician PRN   Emilie Carp M 07/15/2012, 3:51 AM

## 2012-07-15 NOTE — Progress Notes (Signed)
  Subjective: Feeling rectal pressure, but no true urge to push.  Objective: BP 115/73  Pulse 78  Temp(Src) 97.9 F (36.6 C) (Oral)  Resp 18  Ht 4\' 11"  (1.499 m)  Wt 128 lb (58.06 kg)  BMI 25.84 kg/m2  SpO2 100%  LMP 10/15/2011      FHT:  Category 1 UC:   regular, every 3-4 minutes SVE:   Dilation: 10 Effacement (%): 80 Station: +1 Exam by:: Manfred Arch, CNM  Assessment / Plan: 2nd stage labor Will await increased urge to push.  [name] 07/15/2012, 1:04 PM

## 2012-07-15 NOTE — Progress Notes (Signed)
  Subjective: Feeling need to have BM.  Objective: BP 104/67  Pulse 79  Temp(Src) 97.9 F (36.6 C) (Oral)  Resp 22  Ht 4\' 11"  (1.499 m)  Wt 128 lb (58.06 kg)  BMI 25.84 kg/m2  SpO2 100%  LMP 10/15/2011      FHT: Category 1 UC:   regular, every 5 minutes SVE:   Dilation: Lip/rim Effacement (%): 80 Station: 0 Exam by:: Manfred Arch, CNM More cervix R>L Moderate amount stool in rectum.  Assessment / Plan: CTO for increased pressure. Position to facilitate rotation and descent.  Jean Ellis 07/15/2012, 12:06 PM

## 2012-07-15 NOTE — Anesthesia Procedure Notes (Signed)
Epidural Patient location during procedure: OB Start time: 07/15/2012 8:56 AM  Staffing Performed by: anesthesiologist   Preanesthetic Checklist Completed: patient identified, site marked, surgical consent, pre-op evaluation, timeout performed, IV checked, risks and benefits discussed and monitors and equipment checked  Epidural Patient position: sitting Prep: site prepped and draped and DuraPrep Patient monitoring: continuous pulse ox and blood pressure Approach: midline Injection technique: LOR air  Needle:  Needle type: Tuohy  Needle gauge: 17 G Needle length: 9 cm and 9 Needle insertion depth: 4 cm Catheter type: closed end flexible Catheter size: 19 Gauge Catheter at skin depth: 9 cm Test dose: negative  Assessment Events: blood not aspirated, injection not painful, no injection resistance, negative IV test and no paresthesia  Additional Notes Discussed risk of headache, infection, bleeding, nerve injury and failed or incomplete block.  Patient voices understanding and wishes to proceed.  Epidural placed easily on first attempt.  No paresthesia.  Patient tolerated procedure well with no apparent complications.  Jasmine December, MD Reason for block:procedure for pain

## 2012-07-15 NOTE — Progress Notes (Signed)
  Subjective: Comfortable with epidural.  Objective: BP 97/48  Pulse 77  Temp(Src) 98 F (36.7 C) (Oral)  Resp 14  Ht 4\' 11"  (1.499 m)  Wt 128 lb (58.06 kg)  BMI 25.84 kg/m2  SpO2 100%  LMP 10/15/2011      FHT:  Category 1, sporadic mild variables UC:   regular, every 3 minutes per palpation, but poor external toco tracing SVE:   Dilation: 8 Effacement (%): 80 Station: -1 Exam by:: Manfred Arch, CNM IUPC placed  Assessment / Plan: Progressive labor Observe quality of contractions and cervical progress--augment prn.  [name] 07/15/2012, 10:57 AM

## 2012-07-15 NOTE — H&P (Signed)
Jean Ellis is a 27 y.o. female presenting for labor eval. Pt was seen in office today by Dr Estanislado Pandy, pt states Dr Estanislado Pandy told her to come in tonight. Pt c/o persistent ctx, increasing pelvic pressure and ongoing back pain. Pt attended grandfather's funeral today. States she is unable to cope with the pelvic pressure and discomfort anymore. She reports baby has not been moving as much this week because of "less room" reports bloody show all day, denies LOF.  Initial exam by RN 4.5cm, an hour later, almost 5cm w BBOW, cervix anterior, vtx 0-+1 station, ctx remain 5 min apart, pt states they are stronger and requests pain medication.   HPI: Pt began PNC at 7wks, Had initial OB US at approx [redacted]w[redacted]d due to following up for pelvic US and ? GS noted at that time, w +UPT,  F/u dating [place] then at 7wks c/w LMP, EDC=07/21/12 Anatomy US at 18wks, normal and c/w dating [place] at 22wks for S<D, normal growth noted, L cerebral ventricle upper normal limits, R normal, FFN was done for pt c/o pelvic pressure and was neg  [place] at 26wks normal growth, cerebral ventricles stable FFN repeated at 30wks neg [place] at 31wks normal growth, ventricles stable 1hr gtt elevated, 3hr gtt WNL [place] at 34wks for f/u and persistently measuring S<D, normal growth, ventricles normal [place] at 36wks normal growth and ventricles normal  Pt seen in [facility] on 3/17 for EFM due to non-reactive NST in the office, BPP 8/8, although ctx persisted, there was no cervical change from initial assessment on that day of 4cm  Seen in office on 3/21 and still 4cm/80% per Dr Estanislado Pandy     Maternal Medical History:  Reason for admission: Contractions.   Contractions: Onset was 13-24 hours ago.   Frequency: regular.   Duration is approximately 60 seconds.   Perceived severity is mild.    Fetal activity: Perceived fetal activity is normal.   Last perceived fetal movement was within the past hour.    Prenatal complications: no prenatal complications   OB History    Grav Para Term Preterm Abortions TAB SAB Ect Mult Living   1              Past Medical History  Diagnosis Date  . Vitamin B12 deficiency   . Ovarian cyst, left 2013    seen on recent US  . Chronic kidney disease     Frequent kidney infections @ 4-5 yoa  . Anxiety     Prozac in the past  . Depression     Prozac in the past  . False labor after 37 completed weeks of gestation 07/10/2012   History reviewed. No pertinent past surgical history. Family History: family history includes Alcohol abuse in her father; Anemia in her mother; Anxiety disorder in her father; COPD in her father; Depression in her father; Diabetes in her paternal grandfather and paternal grandmother; Drug abuse in her father; Liver disease in her father; and Thyroid disease in her maternal aunt. Social History:  reports that she has never smoked. She has never used smokeless tobacco. She reports that she does not drink alcohol or use illicit drugs.   Prenatal Transfer Tool  Maternal Diabetes: No elevated 1hr gtt, but 3hr gtt WNL Genetic Screening: Declined Maternal Ultrasounds/Referrals: Abnormal:  Findings:   Other: L cerebral ventricle upper limits of normal at 22wks, R ventricle normal, stable until 36wks then normal, anatomy scan was otherwise WNL Fetal Ultrasounds or other Referrals:  None Maternal  Substance Abuse:  No Significant Maternal Medications:  None Significant Maternal Lab Results:  Lab values include: Group B Strep negative Other Comments:  None  Review of Systems  Constitutional: Positive for malaise/fatigue.       Life stressors  Musculoskeletal: Positive for back pain.  Neurological: Positive for dizziness.  All other systems reviewed and are negative.    Dilation: 4.5 Effacement (%): 80 Station: 0 Exam by:: Vance Gather Lilliard CNM  Blood pressure 117/70, pulse 105, temperature 97.9 F (36.6 C), resp. rate 20, height 4\' 11"  (1.499 m), weight 128 lb (58.06 kg), last menstrual period  10/15/2011, SpO2 100.00%. Maternal Exam:  Uterine Assessment: Contraction strength is mild.  Contraction duration is 60 seconds. Contraction frequency is regular.   Abdomen: Patient reports no abdominal tenderness. Fundal height is aga.   Estimated fetal weight is 7#.   Fetal presentation: vertex  Introitus: Normal vulva. Normal vagina.  Pelvis: adequate for delivery.   Cervix: Cervix evaluated by digital exam.     Fetal Exam Fetal Monitor Review: Mode: ultrasound.   Baseline rate: 130.  Variability: moderate (6-25 bpm).   Pattern: accelerations present and no decelerations.    Fetal State Assessment: Category I - tracings are normal.     Physical Exam  Nursing note and vitals reviewed. Constitutional: She is oriented to person, place, and time. She appears well-developed and well-nourished. She appears distressed.  Breathing w ctx, flat affect  HENT:  Head: Normocephalic.  Cardiovascular: Normal rate, regular rhythm and normal heart sounds.   Respiratory: Effort normal and breath sounds normal.  GI: Soft. Bowel sounds are normal.  Genitourinary: Vagina normal.  Bloody show  Musculoskeletal: Normal range of motion.  Neurological: She is alert and oriented to person, place, and time. She has normal reflexes.  Skin: Skin is warm and dry.  Psychiatric: She has a normal mood and affect. Her behavior is normal.    Prenatal labs: ABO, Rh: O/POS/-- (08/08 1525) Antibody: NEG (08/08 1525) Rubella: 17.4 (08/08 1525) RPR: NON REACTIVE (03/17 1505)  HBsAg: NEGATIVE (08/08 1525)  HIV: NON REACTIVE (08/08 1525)  GBS: NEGATIVE (03/03 1416)  Pap/GC/CT neg 12/06/12 TSH normal  Vit D normal Vit B12 normal  hgb 12.0 at 7wks, and 10.9 at 28wks, RPR NR, 1hr gtt =141 3hr gtt WNL GC/CT neg at 36wks    Assessment/Plan: IUP at 39wks Early labor FHR reassuring GBS neg  Admit to b.s. Per c/w Dr Stefano Gaul Routine L&D orders Will give fentanyl IVP, epidural as labor progresses   Pt  agrees to CNM delivery   Darrian Grzelak M 07/15/2012, 1:06 AM

## 2012-07-15 NOTE — Progress Notes (Signed)
  Subjective: Epidural placed recently, getting more comfortable.  Objective: BP 124/71  Pulse 80  Temp(Src) 98 F (36.7 C) (Oral)  Resp 14  Ht 4\' 11"  (1.499 m)  Wt 128 lb (58.06 kg)  BMI 25.84 kg/m2  SpO2 100%  LMP 10/15/2011      FHT:  Category 1, mild variables with some UCs. UC:   regular, every 3 minutes VE deferred at present  Assessment / Plan: Progressive labor Will check cervix when epidural fully functional IUPC prn Augment prn.  [name] 07/15/2012, 9:50 AM

## 2012-07-15 NOTE — Anesthesia Preprocedure Evaluation (Signed)
Anesthesia Evaluation  Patient identified by MRN, date of birth, ID band Patient awake    Reviewed: Allergy & Precautions, H&P , NPO status , Patient's Chart, lab work & pertinent test results, reviewed documented beta blocker date and time   History of Anesthesia Complications Negative for: history of anesthetic complications  Airway Mallampati: I TM Distance: >3 FB Neck ROM: full    Dental  (+) Teeth Intact   Pulmonary neg pulmonary ROS,  breath sounds clear to auscultation        Cardiovascular negative cardio ROS  Rhythm:regular Rate:Normal     Neuro/Psych PSYCHIATRIC DISORDERS (anxiety, depression) negative neurological ROS     GI/Hepatic negative GI ROS, Neg liver ROS,   Endo/Other  negative endocrine ROS  Renal/GU negative Renal ROS  negative genitourinary   Musculoskeletal   Abdominal   Peds  Hematology negative hematology ROS (+)   Anesthesia Other Findings   Reproductive/Obstetrics (+) Pregnancy                           Anesthesia Physical Anesthesia Plan  ASA: II  Anesthesia Plan: Epidural   Post-op Pain Management:    Induction:   Airway Management Planned:   Additional Equipment:   Intra-op Plan:   Post-operative Plan:   Informed Consent: I have reviewed the patients History and Physical, chart, labs and discussed the procedure including the risks, benefits and alternatives for the proposed anesthesia with the patient or authorized representative who has indicated his/her understanding and acceptance.     Plan Discussed with:   Anesthesia Plan Comments:         Anesthesia Quick Evaluation

## 2012-07-16 LAB — CBC
MCV: 84.4 fL (ref 78.0–100.0)
Platelets: 240 10*3/uL (ref 150–400)
RDW: 13.1 % (ref 11.5–15.5)
WBC: 13.5 10*3/uL — ABNORMAL HIGH (ref 4.0–10.5)

## 2012-07-16 MED ORDER — FERROUS SULFATE 325 (65 FE) MG PO TABS
325.0000 mg | ORAL_TABLET | Freq: Two times a day (BID) | ORAL | Status: DC
  Administered 2012-07-16 – 2012-07-17 (×3): 325 mg via ORAL
  Filled 2012-07-16 (×3): qty 1

## 2012-07-16 NOTE — Progress Notes (Signed)
Patient was referred for history of depression/anxiety. * Referral screened out by Clinical Social Worker because none of the following criteria appear to apply:  ~ History of anxiety/depression during this pregnancy, or of post-partum depression.  ~ Diagnosis of anxiety and/or depression within last 3 years, as per pt.  ~ History of depression due to pregnancy loss/loss of child  OR * Patient's symptoms currently being treated with medication and/or therapy.  Please contact the Clinical Social Worker if needs arise, or by the patient's request.  

## 2012-07-16 NOTE — Anesthesia Postprocedure Evaluation (Signed)
  Anesthesia Post-op Note  Patient: Jean Ellis  Procedure(s) Performed: * No procedures listed *  Patient Location: Mother/Baby  Anesthesia Type:Epidural  Level of Consciousness: awake  Airway and Oxygen Therapy: Patient Spontanous Breathing  Post-op Pain: mild  Post-op Assessment: Patient's Cardiovascular Status Stable and Respiratory Function Stable  Post-op Vital Signs: stable  Complications: No apparent anesthesia complications

## 2012-07-16 NOTE — Progress Notes (Addendum)
Post Partum Day 1:S/P WVB, 2nd degree vaginal laceration Subjective: Patient up ad lib, denies syncope or dizziness. Feeding:  Breast Contraceptive plan:   Undecided  Objective: Blood pressure 98/61, pulse 76, temperature 97.9 F (36.6 C), temperature source Oral, resp. rate 18, height 4\' 11"  (1.499 m), weight 128 lb (58.06 kg), last menstrual period 10/15/2011, SpO2 100.00%, unknown if currently breastfeeding.  Physical Exam:  General: alert Lochia: appropriate Uterine Fundus: firm Incision: healing well DVT Evaluation: No evidence of DVT seen on physical exam. Negative Homan's sign.   Recent Labs  07/15/12 0126 07/16/12 0627  HGB 10.5* 8.8*  HCT 31.8* 27.1*  Orthostatics stable  Assessment/Plan: S/P Vaginal delivery day 1 Anemia without hemodynamic instability.  Continue current care FeSO4 q day. Plan for discharge tomorrow   LOS: 2 days   [name] 07/16/2012, 8:38 AM

## 2012-07-17 MED ORDER — NORETHINDRONE 0.35 MG PO TABS: 1.0000 | ORAL_TABLET | Freq: Every day | ORAL | Status: AC

## 2012-07-17 MED ORDER — IBUPROFEN 600 MG PO TABS: 600.0000 mg | ORAL_TABLET | Freq: Four times a day (QID) | ORAL | Status: DC | PRN

## 2012-07-17 MED ORDER — OXYCODONE-ACETAMINOPHEN 5-325 MG PO TABS: 1.0000 | ORAL_TABLET | ORAL | Status: DC | PRN

## 2012-07-17 NOTE — Discharge Summary (Signed)
  Vaginal Delivery Discharge Summary  Jean Ellis  DOB:    1985-10-18 MRN:    [id number] CSN:    [id number]  Date of admission:                  07/14/12  Date of discharge:                   07/17/12  Procedures this admission:  SVB Repair of 2nd degree vaginal laceration  Newborn Data:  Live born female  Birth Weight: 6 lb 14.8 oz (3141 g) APGAR: 8, 9  Home with mother. Name: Valentina Gu  History of Present Illness:  Ms. Ramsey Midgett is a 27 y.o. female, G1P1001, who presents at [redacted]w[redacted]d weeks gestation. The patient has been followed at the [facility] and Gynecology division of [facility]. She was admitted for early labor.  Her pregnancy has been complicated by:  Patient Active Problem List  Diagnosis  . H/O bulimia nervosa  . H/O anorexia nervosa  . Irregular menstrual cycle  . H/O non anemic vitamin B12 deficiency - nl f/u  . Anxiety  . Depression  . Vaginal delivery  . Perineal laceration with delivery, second degree     Hospital course:  The patient was admitted for early labor.   Her labor was not complicated. She proceeded to have a vaginal delivery of a healthy infant. Her delivery was not complicated, and was attended by [name], CNM.  Her postpartum course was not complicated.  Breastfeeding was going well.  She was discharged to home on postpartum day 2 doing well.  She was using Motrin and Percocet with benefit.  Feeding:  breast  Contraception:  oral progesterone-only contraceptive  Discharge hemoglobin:  Hemoglobin  Date Value Range Status  07/16/2012 8.8* 12.0 - 15.0 g/dL Final     HCT  Date Value Range Status  07/16/2012 27.1* 36.0 - 46.0 % Final  Pre-delivery Hgb 10.5.  Discharge Physical Exam:   General: alert Lochia: appropriate Uterine Fundus: firm Incision: healing well DVT Evaluation: No evidence of DVT seen on physical exam. Negative Homan's sign.  Intrapartum Procedures: spontaneous  vaginal delivery Postpartum Procedures: none Complications-Operative and Postpartum: 2nd degree vaginal laceration  Discharge Diagnoses: Term Pregnancy-delivered, pp anemia without hemodynamic instability.  Discharge Information:  Activity:           Per [facility] handout Diet:                routine Medications: Ibuprofen, Percocet and Micronor Condition:      stable Instructions:  refer to practice specific booklet Discharge to: home  Follow-up Information   Follow up with [facility]. Schedule an appointment as soon as possible for a visit in 6 weeks. (Call for any questions or concerns.)    Contact information:   [address] [place] [address] [phone]       [name] 07/17/2012

## 2013-07-17 ENCOUNTER — Emergency Department (HOSPITAL_BASED_OUTPATIENT_CLINIC_OR_DEPARTMENT_OTHER)
Admission: EM | Admit: 2013-07-17 | Discharge: 2013-07-17 | Disposition: A | Discharge status: Home / Self Care | Payer: Worker's Compensation | Attending: Emergency Medicine | Admitting: Emergency Medicine

## 2013-07-17 ENCOUNTER — Encounter (HOSPITAL_BASED_OUTPATIENT_CLINIC_OR_DEPARTMENT_OTHER): Payer: Self-pay | Admitting: Emergency Medicine

## 2013-07-17 DIAGNOSIS — S060XAA Concussion with loss of consciousness status unknown, initial encounter: Secondary | ICD-10-CM

## 2013-07-17 DIAGNOSIS — S060X9A Concussion with loss of consciousness of unspecified duration, initial encounter: Secondary | ICD-10-CM

## 2013-07-17 DIAGNOSIS — IMO0002 Reserved for concepts with insufficient information to code with codable children: Secondary | ICD-10-CM | POA: Insufficient documentation

## 2013-07-17 DIAGNOSIS — Y990 Civilian activity done for income or pay: Secondary | ICD-10-CM | POA: Insufficient documentation

## 2013-07-17 DIAGNOSIS — Z8742 Personal history of other diseases of the female genital tract: Secondary | ICD-10-CM | POA: Insufficient documentation

## 2013-07-17 DIAGNOSIS — N189 Chronic kidney disease, unspecified: Secondary | ICD-10-CM | POA: Insufficient documentation

## 2013-07-17 DIAGNOSIS — Y929 Unspecified place or not applicable: Secondary | ICD-10-CM | POA: Insufficient documentation

## 2013-07-17 DIAGNOSIS — Y9389 Activity, other specified: Secondary | ICD-10-CM | POA: Insufficient documentation

## 2013-07-17 DIAGNOSIS — Z862 Personal history of diseases of the blood and blood-forming organs and certain disorders involving the immune mechanism: Secondary | ICD-10-CM | POA: Insufficient documentation

## 2013-07-17 DIAGNOSIS — Z8639 Personal history of other endocrine, nutritional and metabolic disease: Secondary | ICD-10-CM | POA: Insufficient documentation

## 2013-07-17 DIAGNOSIS — Z3202 Encounter for pregnancy test, result negative: Secondary | ICD-10-CM | POA: Insufficient documentation

## 2013-07-17 DIAGNOSIS — S060X0A Concussion without loss of consciousness, initial encounter: Secondary | ICD-10-CM | POA: Insufficient documentation

## 2013-07-17 DIAGNOSIS — Z8659 Personal history of other mental and behavioral disorders: Secondary | ICD-10-CM | POA: Insufficient documentation

## 2013-07-17 LAB — URINALYSIS, ROUTINE W REFLEX MICROSCOPIC
Bilirubin Urine: NEGATIVE
Glucose, UA: NEGATIVE mg/dL
Hgb urine dipstick: NEGATIVE
Ketones, ur: NEGATIVE mg/dL
Leukocytes, UA: NEGATIVE
Nitrite: NEGATIVE
Protein, ur: NEGATIVE mg/dL
Specific Gravity, Urine: 1.023 (ref 1.005–1.030)
Urobilinogen, UA: 0.2 mg/dL (ref 0.0–1.0)
pH: 6.5 (ref 5.0–8.0)

## 2013-07-17 LAB — PREGNANCY, URINE: PREG TEST UR: NEGATIVE

## 2013-07-17 MED ORDER — HYDROCODONE-ACETAMINOPHEN 5-325 MG PO TABS: 1.0000 | ORAL_TABLET | Freq: Once | ORAL | Status: DC
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
## 2013-07-17 NOTE — ED Provider Notes (Signed)
Medical screening examination/treatment/procedure(s) were performed by non-physician practitioner and as supervising physician I was immediately available for consultation/collaboration.   EKG Interpretation None        Tahjir Silveria B. Mina Carlisi, MD 07/17/13 2225 

## 2013-07-17 NOTE — ED Notes (Signed)
Pt states will take a motrin when she gets home, refused norco d/t having a 28yo to care for

## 2013-07-17 NOTE — Discharge Instructions (Signed)

## 2014-02-25 ENCOUNTER — Encounter (HOSPITAL_BASED_OUTPATIENT_CLINIC_OR_DEPARTMENT_OTHER): Payer: Self-pay | Admitting: Emergency Medicine

## 2014-12-17 IMAGING — US US OB LIMITED
1 series · 13 of 28 positions shown · non-contrast
Comparison: none

CLINICAL DATA: Assigned gestational age of 38 weeks 3 days,
presenting with decelerations.  Evaluate amniotic fluid.

[Series 1: us fetal bpp w/o nonstress · non-contrast · 28 acquisitions, 13 frames shown]
[im 2/28]
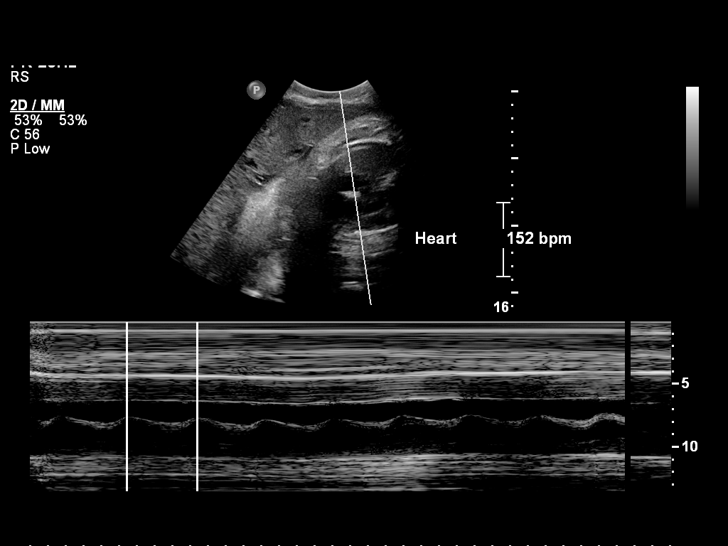
[im 4/28]
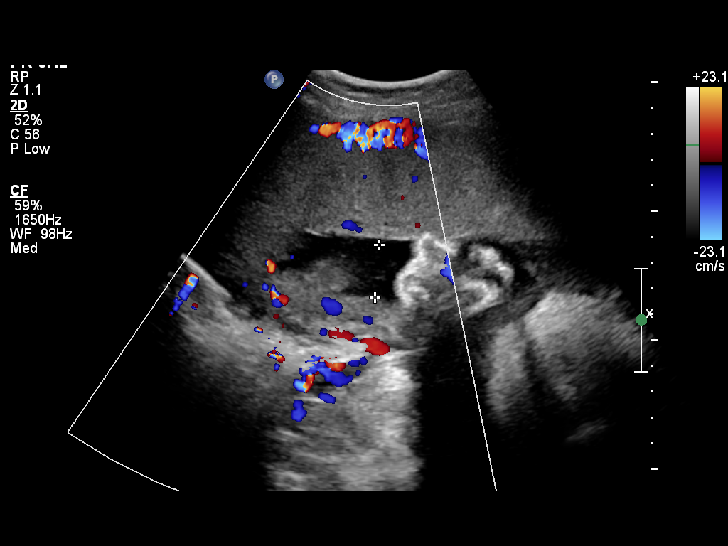
[im 6/28]
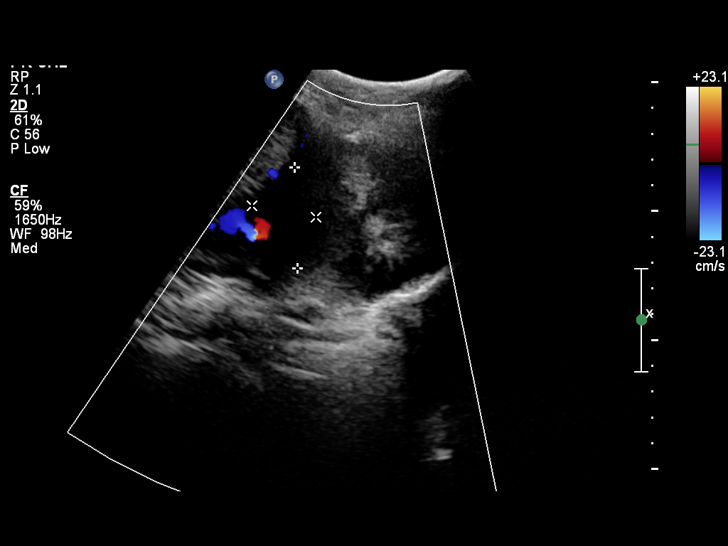
[im 8/28]
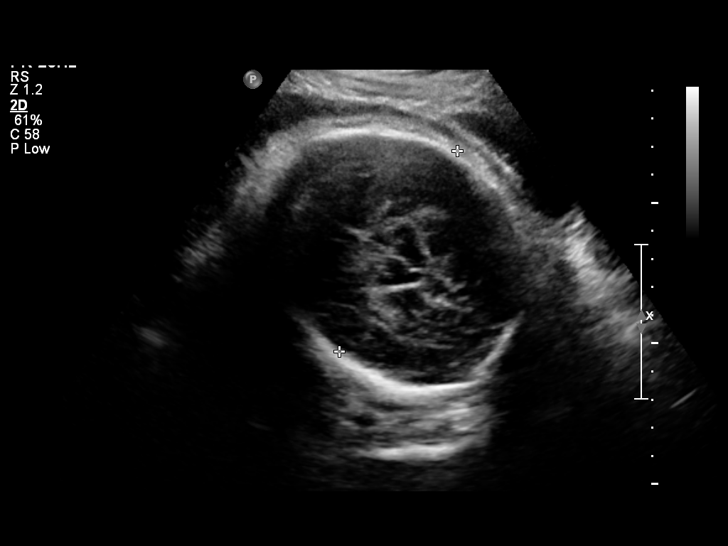
[im 10/28]
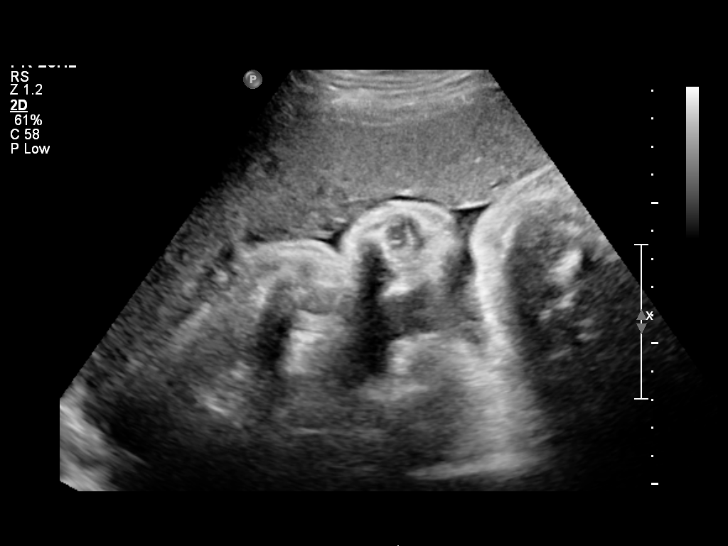
[im 12/28]
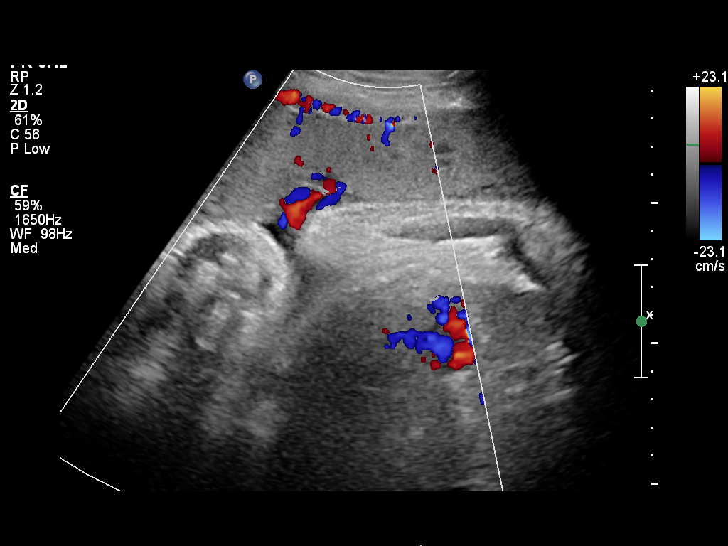
[im 15/28]
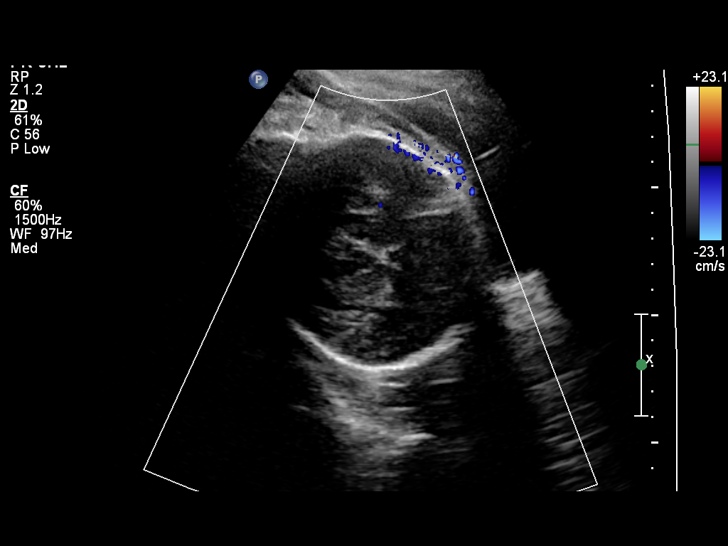
[im 17/28]
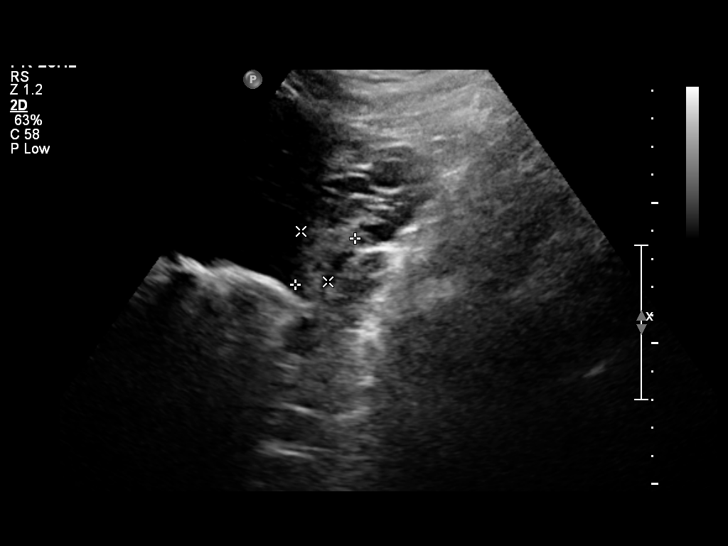
[im 19/28]
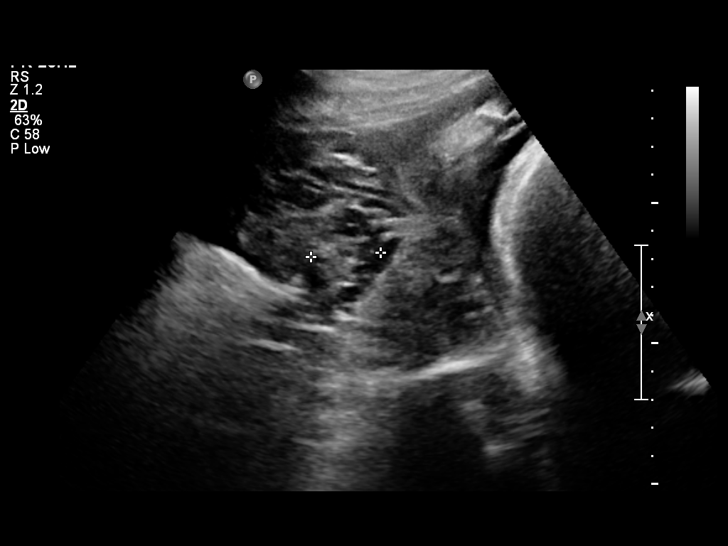
[im 21/28]
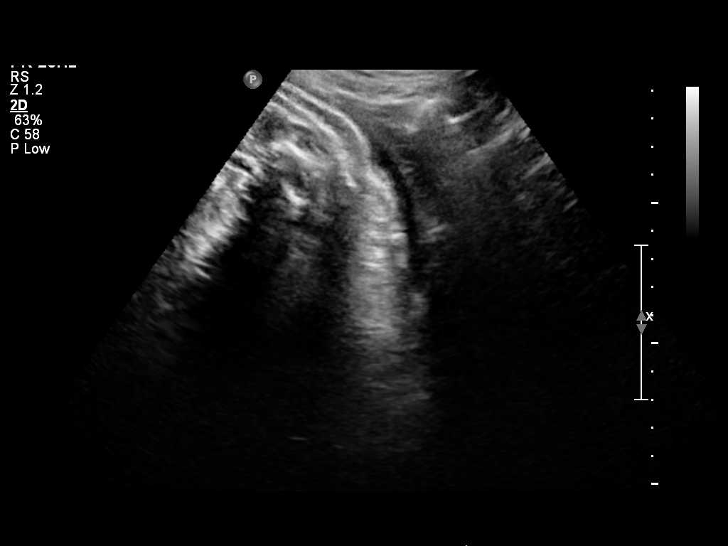
[im 23/28]
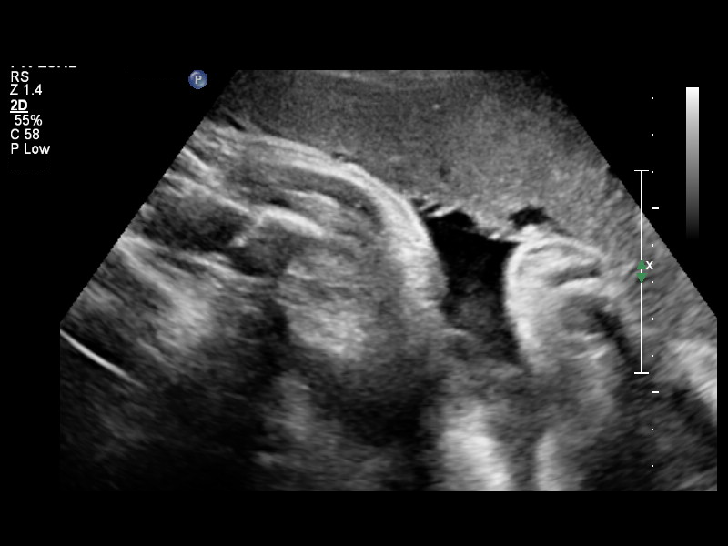
[im 25/28]
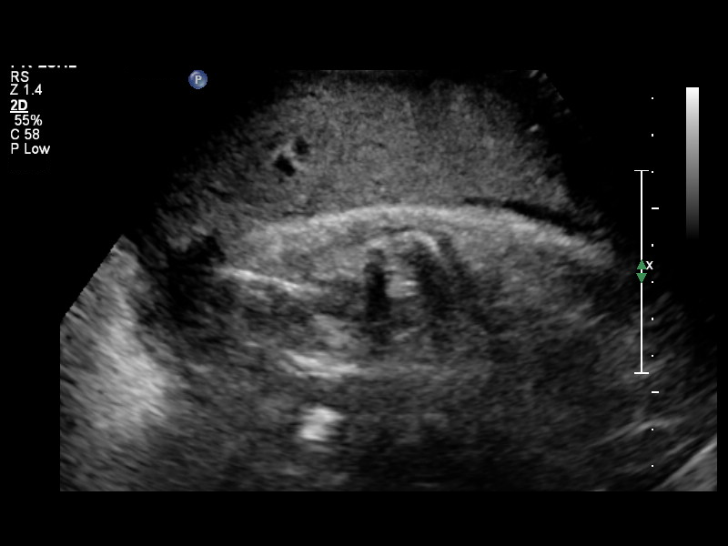
[im 27/28]
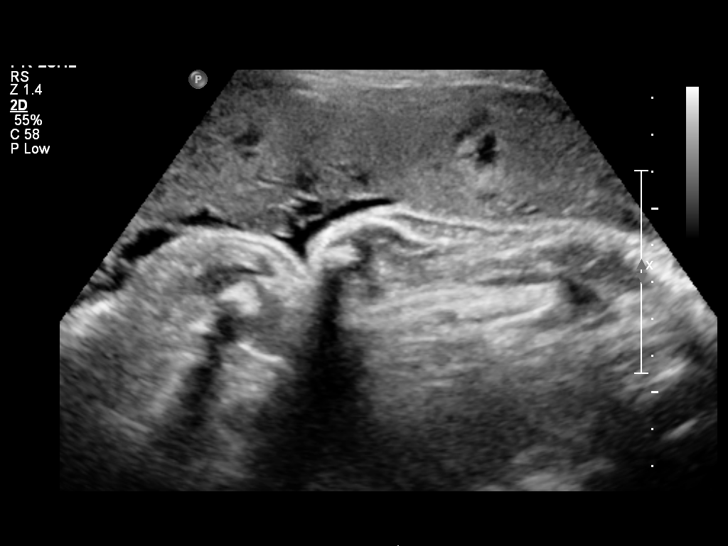

[13 of 28 positions shown; findings below may reference images not displayed]

LIMITED OBSTETRIC ULTRASOUND

Number of Fetuses: 1
Heart Rate: 152 bpm
Movement: Visualized.
Breathing: Visualized.
Presentation: Cephalic.
Placental Location: Anterior.
Previa: No.
Amniotic Fluid (Subjective): Normal.

Vertical Pocket 4.3 cm     AFI 12.7 cm  (5%ile = 7.3 cm; 95%ile =
23.9 cm)

BPD:  8.36 cm     33 w  5 d         EDC:  08/23/2012.

MATERNAL FINDINGS:
Cervix: Not visualized due to advanced gestational age.
Uterus/Adnexae:  Right ovary normal in size and appearance
containing small follicular cyst, measuring approximately 2.7 x
x 2.5 cm.  Left ovary not visualized.  No adnexal masses or free
pelvic fluid.
IMPRESSION: 1.  Single live intrauterine fetus in cephalic presentation.
2.  Subjectively and objectively normal amniotic fluid.
3.  Anterior placenta without evidence of previa.

BIOPHYSICAL PROFILE

Movement: 2                   Time: 9 minutes
Breathing: 2
Tone: 2
Amniotic Fluid: 2

Total Score: [DATE]

Recommend followup with non-emergent complete OB 14+ wk US
examination for fetal biometric evaluation and anatomic survey if
not already performed.

## 2016-10-25 HISTORY — PX: WISDOM TOOTH EXTRACTION: SHX21

## 2017-08-11 ENCOUNTER — Ambulatory Visit: Payer: Self-pay | Admitting: Cardiology

## 2017-09-28 ENCOUNTER — Encounter (INDEPENDENT_AMBULATORY_CARE_PROVIDER_SITE_OTHER): Payer: Self-pay

## 2017-09-28 ENCOUNTER — Encounter: Payer: Self-pay | Admitting: Cardiology

## 2017-09-28 ENCOUNTER — Ambulatory Visit: Payer: BC Managed Care – PPO | Admitting: Cardiology

## 2017-09-28 VITALS — BP 110/78 | HR 67 | Ht 59.0 in | Wt 103.4 lb

## 2017-09-28 DIAGNOSIS — R002 Palpitations: Secondary | ICD-10-CM | POA: Diagnosis not present

## 2017-09-28 NOTE — Patient Instructions (Signed)
Medication Instructions:  Your physician recommends that you continue on your current medications as directed. Please refer to the Current Medication list given to you today.  If you need a refill on your cardiac medications, please contact your pharmacy first.  Labwork: None ordered   Testing/Procedures: Your physician has recommended that you wear an event monitor. Event monitors are medical devices that record the heart's electrical activity. Doctors most often us these monitors to diagnose arrhythmias. Arrhythmias are problems with the speed or rhythm of the heartbeat. The monitor is a small, portable device. You can wear one while you do your normal daily activities. This is usually used to diagnose what is causing palpitations/syncope (passing out).  Follow-Up: Your physician wants you to follow-up AS NEEDED with Dr. Mayford Knifeurner.   Any Other Special Instructions Will Be Listed Below (If Applicable).   [facility]    Lyda PeroneRena Sable Knoles, RN  [phone]  If you need a refill on your cardiac medications before your next appointment, please call your pharmacy.

## 2017-09-28 NOTE — Progress Notes (Signed)
Cardiology Office Note    Date:  09/28/2017   ID:  Jean FeilHeather Ellis, DOB 1985/11/12, MRN [id number]  PCP:  Jean Ellis, Jean C., MD (Inactive)  Cardiologist:  Jean Magicraci Turner, MD   Chief Complaint  Patient presents with  . Palpitations    History of Present Illness:  Jean Ellis is a 32 y.o. female who is being seen today for the evaluation of palpitations at the request of Jean LaySandra Rivard, MD.  This is a 32yo female with a history of anorexia, bulimia, depression and anxiety who presents today for evaluation of palpitations.  Says the palpitations have been going on for years but recently have been increased in frequency.  She says she will just be sitting and all of a sudden will have a sinking sensation in her chest.  This occurs 1-2 times per week.  There is no associated dizziness syncope chest pain or shortness of breath with it.  It will only be one episode and then resolved.  She has had her thyroid checked several times and that has been normal.  She has been treated for anxiety but no change in this any sensation in her chest.  She says she is chronically fatigued but attributes it to working 2 jobs was having a 30-year-old.  About a year ago she had some chest pain that she described as pins-and-needles stabbing sensation in her chest but this resolved after going on a homeopathic remedy for anxiety.  She denies any PND orthopnea, orthopnea, dyspnea on exertion, exertional chest pain, dizziness, lower extremity edema or syncope.    Past Medical History:  Diagnosis Date  . Anorexia nervosa   . Anxiety    Prozac in the past  . Bulimia nervosa   . Chronic kidney disease    Frequent kidney infections @ 4-5 yoa  . Depression    Prozac in the past  . False labor after 37 completed weeks of gestation 07/10/2012  . Ovarian cyst, left 2013   seen on recent US  . Vitamin B12 deficiency     Past Surgical History:  Procedure Laterality Date  . WISDOM TOOTH EXTRACTION   10/25/2016    Current Medications: Current Meds  Medication Sig  . norethindrone (ORTHO MICRONOR) 0.35 MG tablet Take 1 tablet (0.35 mg total) by mouth daily.    Allergies:   Patient has no known allergies.   Social History   Socioeconomic History  . Marital status: Married    Spouse name: Jean Ellis  . Number of children: Not on file  . Years of education: 5916  . Highest education level: Not on file  Occupational History  . Occupation: Crisis Education administratorintervention    Employer: [employer]    Comment: [employer]  Social Needs  . Financial resource strain: Not on file  . Food insecurity:    Worry: Not on file    Inability: Not on file  . Transportation needs:    Medical: Not on file    Non-medical: Not on file  Tobacco Use  . Smoking status: Never Smoker  . Smokeless tobacco: Never Used  Substance and Sexual Activity  . Alcohol use: No    Comment: 1 glass of wine bi-weekly  . Drug use: No  . Sexual activity: Yes    Partners: Male    Birth control/protection: Pill, None  Lifestyle  . Physical activity:    Days per week: Not on file    Minutes per session: Not on file  . Stress:  Not on file  Relationships  . Social connections:    Talks on phone: Not on file    Gets together: Not on file    Attends religious service: Not on file    Active member of club or organization: Not on file    Attends meetings of clubs or organizations: Not on file    Relationship status: Not on file  Other Topics Concern  . Not on file  Social History Narrative  . Not on file     Family History:  The patient's family history includes Alcohol abuse in her father; Anemia in her mother; Anxiety disorder in her father; COPD in her father; Depression in her father; Diabetes in her paternal grandfather and paternal grandmother; Drug abuse in her father; Liver disease in her father; Thyroid disease in her maternal aunt.   ROS:   Please see the history of present  illness.    ROS All other systems reviewed and are negative.  No flowsheet data found.     PHYSICAL EXAM:   VS:  BP 110/78   Pulse 67   Ht 4\' 11"  (1.499 m)   Wt 103 lb 6.4 oz (46.9 kg)   BMI 20.88 kg/m    GEN: Well nourished, well developed, in no acute distress  HEENT: normal  Neck: no JVD, carotid bruits, or masses Cardiac: RRR; no murmurs, rubs, or gallops,no edema.  Intact distal pulses bilaterally.  Respiratory:  clear to auscultation bilaterally, normal work of breathing GI: soft, nontender, nondistended, + BS MS: no deformity or atrophy  Skin: warm and dry, no rash Neuro:  Alert and Oriented x 3, Strength and sensation are intact Psych: euthymic mood, full affect  Wt Readings from Last 3 Encounters:  09/28/17 103 lb 6.4 oz (46.9 kg)  07/17/13 95 lb (43.1 kg)  07/15/12 128 lb (58.1 kg)      Studies/Labs Reviewed:   EKG:  EKG is ordered today.  The ekg ordered today demonstrates normal sinus rhythm at 67 bpm with no ST-T wave of normalities.  Recent Labs: No results found for requested labs within last 8760 hours.   Lipid Panel No results found for: CHOL, TRIG, HDL, CHOLHDL, VLDL, LDLCALC, LDLDIRECT  Additional studies/ records that were reviewed today include:  Office notes form OB/GYN    ASSESSMENT:    1. Heart palpitations      PLAN:  In order of problems listed above:  1. Palpitations -I suspect she is having PVCs or PACs that are giving her the sinking sensation in her chest.  She does drink 1 cup of coffee at 6 AM and sometimes a Dr. Reino Kent in the evening but denies any alcohol use.  She drinks water throughout the rest of the day.  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  Will check 30 day event monitor to rule out arrhythmia.    Medication Adjustments/Labs and Tests Ordered: Current medicines are reviewed at length with  the patient today.  Concerns regarding medicines are outlined above.  Medication changes, Labs and Tests ordered today are listed in the Patient Instructions below.  There are no Patient Instructions on file for this visit.   Signed, Jean Magic, MD  09/28/2017 9:18 AM    [facility] [address] [place], [place], [place]  [address] Phone: [phone]; Fax: [phone]

## 2017-10-04 ENCOUNTER — Ambulatory Visit (INDEPENDENT_AMBULATORY_CARE_PROVIDER_SITE_OTHER): Payer: BC Managed Care – PPO

## 2017-10-04 DIAGNOSIS — R002 Palpitations: Secondary | ICD-10-CM | POA: Diagnosis not present

## 2017-11-08 ENCOUNTER — Telehealth: Payer: Self-pay | Admitting: *Deleted

## 2017-11-08 DIAGNOSIS — I493 Ventricular premature depolarization: Secondary | ICD-10-CM

## 2017-11-08 DIAGNOSIS — R002 Palpitations: Secondary | ICD-10-CM

## 2017-11-08 NOTE — Telephone Encounter (Signed)
Notes recorded by Quintella Reicherturner, Traci R, MD on 11/08/2017 at 1:07 PM EDT Heart monitor showed occasional extra heartbeats from the top and bottom of the heart. Please get a bmet, magnesium and TSH. Check a 2D echocardiogram to assess for structural heart disease. These are likely benign. Please find out if these have settled down after stopping caffeine intake.   Spoke with Jean Ellis and went over results and recommendations per Dr. Mayford Knifeurner.  Advised I would place order for echo and have scheduling dept contact Jean Ellis.  Advised we would draw labs same day as echo.  Jean Ellis verbalized understanding and was in agreement with this plan.

## 2017-11-17 ENCOUNTER — Telehealth: Payer: Self-pay

## 2017-11-17 ENCOUNTER — Other Ambulatory Visit: Payer: BC Managed Care – PPO | Admitting: *Deleted

## 2017-11-17 ENCOUNTER — Ambulatory Visit (HOSPITAL_COMMUNITY): Payer: BC Managed Care – PPO | Attending: Cardiology

## 2017-11-17 ENCOUNTER — Other Ambulatory Visit: Payer: Self-pay

## 2017-11-17 DIAGNOSIS — R630 Anorexia: Secondary | ICD-10-CM | POA: Diagnosis not present

## 2017-11-17 DIAGNOSIS — R002 Palpitations: Secondary | ICD-10-CM | POA: Diagnosis not present

## 2017-11-17 DIAGNOSIS — I493 Ventricular premature depolarization: Secondary | ICD-10-CM

## 2017-11-17 DIAGNOSIS — N189 Chronic kidney disease, unspecified: Secondary | ICD-10-CM | POA: Diagnosis not present

## 2017-11-17 LAB — BASIC METABOLIC PANEL
BUN/Creatinine Ratio: 12 (ref 9–23)
BUN: 9 mg/dL (ref 6–20)
CALCIUM: 9.7 mg/dL (ref 8.7–10.2)
CHLORIDE: 101 mmol/L (ref 96–106)
CO2: 23 mmol/L (ref 20–29)
Creatinine, Ser: 0.73 mg/dL (ref 0.57–1.00)
GFR calc Af Amer: 128 mL/min/{1.73_m2} (ref >59)
GFR calc non Af Amer: 111 mL/min/{1.73_m2} (ref >59)
Glucose: 79 mg/dL (ref 65–99)
POTASSIUM: 4.4 mmol/L (ref 3.5–5.2)
Sodium: 139 mmol/L (ref 134–144)

## 2017-11-17 LAB — MAGNESIUM: Magnesium: 1.9 mg/dL (ref 1.6–2.3)

## 2017-11-17 LAB — TSH: TSH: 2.86 u[IU]/mL (ref 0.450–4.500)

## 2017-11-17 NOTE — Telephone Encounter (Signed)
Notes recorded by Sigurd Sosapp, Khoen Genet, RN on 11/17/2017 at 4:30 PM EDT Reviewed results/recommendations with patient and she verbalized understanding.

## 2017-11-17 NOTE — Telephone Encounter (Signed)
-----   Message from Quintella Reichertraci R Turner, MD sent at 11/17/2017  4:25 PM EDT ----- Please let patient know that labs were normal.  Continue current medical therapy.
